# Patient Record
Sex: Male | Born: 1960 | Race: White | Hispanic: No | Marital: Married | Smoking: Never smoker
Health system: Southern US, Community
[De-identification: ages and names within clinical notes are randomized; demographics above are authoritative.]

## PROBLEM LIST (undated history)

## (undated) DIAGNOSIS — I2510 Atherosclerotic heart disease of native coronary artery without angina pectoris: Secondary | ICD-10-CM

## (undated) DIAGNOSIS — I4891 Unspecified atrial fibrillation: Secondary | ICD-10-CM

## (undated) DIAGNOSIS — I10 Essential (primary) hypertension: Secondary | ICD-10-CM

## (undated) DIAGNOSIS — E785 Hyperlipidemia, unspecified: Secondary | ICD-10-CM

## (undated) DIAGNOSIS — I679 Cerebrovascular disease, unspecified: Secondary | ICD-10-CM

## (undated) DIAGNOSIS — G4733 Obstructive sleep apnea (adult) (pediatric): Secondary | ICD-10-CM

## (undated) HISTORY — DX: Obstructive sleep apnea (adult) (pediatric): G47.33

## (undated) HISTORY — DX: Morbid (severe) obesity due to excess calories: E66.01

## (undated) HISTORY — DX: Cerebrovascular disease, unspecified: I67.9

## (undated) HISTORY — DX: Unspecified atrial fibrillation: I48.91

## (undated) HISTORY — DX: Hyperlipidemia, unspecified: E78.5

## (undated) HISTORY — PX: ATRIAL ABLATION SURGERY: SHX560

## (undated) HISTORY — DX: Essential (primary) hypertension: I10

## (undated) HISTORY — PX: EYE SURGERY: SHX253

## (undated) HISTORY — DX: Atherosclerotic heart disease of native coronary artery without angina pectoris: I25.10

---

## 1999-05-31 ENCOUNTER — Inpatient Hospital Stay (HOSPITAL_COMMUNITY): Admission: AD | Admit: 1999-05-31 | Discharge: 1999-06-08 | Payer: Self-pay | Admitting: *Deleted

## 1999-06-09 ENCOUNTER — Other Ambulatory Visit: Admission: RE | Admit: 1999-06-09 | Discharge: 1999-06-15 | Payer: Self-pay

## 2001-06-14 ENCOUNTER — Encounter: Payer: Self-pay | Admitting: Emergency Medicine

## 2001-06-14 ENCOUNTER — Emergency Department (HOSPITAL_COMMUNITY): Admission: EM | Admit: 2001-06-14 | Discharge: 2001-06-14 | Payer: Self-pay | Admitting: Emergency Medicine

## 2004-09-04 DIAGNOSIS — I2510 Atherosclerotic heart disease of native coronary artery without angina pectoris: Secondary | ICD-10-CM

## 2004-09-04 HISTORY — DX: Atherosclerotic heart disease of native coronary artery without angina pectoris: I25.10

## 2005-04-26 ENCOUNTER — Inpatient Hospital Stay (HOSPITAL_COMMUNITY): Admission: AD | Admit: 2005-04-26 | Discharge: 2005-04-30 | Payer: Self-pay | Admitting: Cardiology

## 2005-04-26 ENCOUNTER — Encounter: Payer: Self-pay | Admitting: Emergency Medicine

## 2005-04-28 ENCOUNTER — Ambulatory Visit: Payer: Self-pay | Admitting: Cardiology

## 2005-05-15 ENCOUNTER — Ambulatory Visit: Payer: Self-pay | Admitting: Internal Medicine

## 2005-05-22 ENCOUNTER — Observation Stay (HOSPITAL_COMMUNITY): Admission: EM | Admit: 2005-05-22 | Discharge: 2005-05-23 | Payer: Self-pay | Admitting: *Deleted

## 2005-05-25 ENCOUNTER — Encounter (HOSPITAL_COMMUNITY): Admission: RE | Admit: 2005-05-25 | Discharge: 2005-08-23 | Payer: Self-pay | Admitting: Cardiology

## 2005-07-12 ENCOUNTER — Ambulatory Visit: Payer: Self-pay | Admitting: Cardiology

## 2005-08-24 ENCOUNTER — Encounter (HOSPITAL_COMMUNITY): Admission: RE | Admit: 2005-08-24 | Discharge: 2005-09-04 | Payer: Self-pay | Admitting: Cardiology

## 2005-09-05 ENCOUNTER — Encounter (HOSPITAL_COMMUNITY): Admission: RE | Admit: 2005-09-05 | Discharge: 2005-12-04 | Payer: Self-pay | Admitting: Cardiology

## 2005-09-06 ENCOUNTER — Ambulatory Visit: Payer: Self-pay | Admitting: Cardiology

## 2005-10-09 ENCOUNTER — Encounter: Payer: Self-pay | Admitting: Cardiology

## 2005-10-09 ENCOUNTER — Ambulatory Visit: Payer: Self-pay

## 2005-10-09 ENCOUNTER — Ambulatory Visit: Payer: Self-pay | Admitting: Cardiology

## 2005-12-05 ENCOUNTER — Encounter (HOSPITAL_COMMUNITY): Admission: RE | Admit: 2005-12-05 | Discharge: 2006-02-28 | Payer: Self-pay | Admitting: Cardiology

## 2006-03-19 ENCOUNTER — Ambulatory Visit: Payer: Self-pay | Admitting: Cardiology

## 2006-03-28 ENCOUNTER — Encounter: Payer: Self-pay | Admitting: Cardiology

## 2006-03-28 ENCOUNTER — Ambulatory Visit: Payer: Self-pay

## 2006-10-09 ENCOUNTER — Ambulatory Visit: Payer: Self-pay | Admitting: Cardiology

## 2006-10-09 LAB — CONVERTED CEMR LAB
Alkaline Phosphatase: 51 units/L (ref 39–117)
BUN: 13 mg/dL (ref 6–23)
CO2: 32 meq/L (ref 19–32)
Creatinine, Ser: 0.9 mg/dL (ref 0.4–1.5)
Glucose, Bld: 102 mg/dL — ABNORMAL HIGH (ref 70–99)
Potassium: 4.4 meq/L (ref 3.5–5.1)
TSH: 1.67 microintl units/mL (ref 0.35–5.50)
Total Bilirubin: 1.1 mg/dL (ref 0.3–1.2)
Total Protein: 6.6 g/dL (ref 6.0–8.3)

## 2006-10-10 ENCOUNTER — Encounter: Payer: Self-pay | Admitting: Cardiology

## 2006-10-10 ENCOUNTER — Ambulatory Visit: Payer: Self-pay

## 2006-10-16 ENCOUNTER — Ambulatory Visit: Payer: Self-pay | Admitting: Cardiology

## 2006-10-19 ENCOUNTER — Ambulatory Visit: Payer: Self-pay | Admitting: Cardiology

## 2006-10-23 ENCOUNTER — Ambulatory Visit: Payer: Self-pay | Admitting: Cardiology

## 2006-10-30 ENCOUNTER — Ambulatory Visit: Payer: Self-pay | Admitting: Cardiology

## 2006-11-06 ENCOUNTER — Ambulatory Visit: Payer: Self-pay | Admitting: Internal Medicine

## 2006-11-13 ENCOUNTER — Ambulatory Visit: Payer: Self-pay | Admitting: Cardiology

## 2006-11-21 ENCOUNTER — Ambulatory Visit: Payer: Self-pay | Admitting: Cardiology

## 2006-11-27 ENCOUNTER — Ambulatory Visit: Payer: Self-pay | Admitting: Cardiology

## 2006-12-04 ENCOUNTER — Ambulatory Visit: Payer: Self-pay | Admitting: Internal Medicine

## 2006-12-12 ENCOUNTER — Ambulatory Visit: Payer: Self-pay | Admitting: Cardiology

## 2006-12-18 ENCOUNTER — Ambulatory Visit: Payer: Self-pay | Admitting: Cardiovascular Disease

## 2006-12-18 LAB — CONVERTED CEMR LAB
Basophils Relative: 0.7 % (ref 0.0–1.0)
CO2: 32 meq/L (ref 19–32)
Eosinophils Relative: 9.2 % — ABNORMAL HIGH (ref 0.0–5.0)
GFR calc Af Amer: 117 mL/min
Glucose, Bld: 91 mg/dL (ref 70–99)
Hemoglobin: 16.2 g/dL (ref 13.0–17.0)
Lymphocytes Relative: 34.6 % (ref 12.0–46.0)
Monocytes Absolute: 0.7 10*3/uL (ref 0.2–0.7)
Monocytes Relative: 11.8 % — ABNORMAL HIGH (ref 3.0–11.0)
Neutro Abs: 2.7 10*3/uL (ref 1.4–7.7)
Potassium: 4.3 meq/L (ref 3.5–5.1)
Prothrombin Time: 17.8 s — ABNORMAL HIGH (ref 10.0–14.0)
WBC: 6.1 10*3/uL (ref 4.5–10.5)

## 2006-12-26 ENCOUNTER — Ambulatory Visit: Payer: Self-pay | Admitting: Internal Medicine

## 2006-12-31 ENCOUNTER — Ambulatory Visit: Payer: Self-pay | Admitting: Cardiology

## 2007-01-01 ENCOUNTER — Ambulatory Visit: Payer: Self-pay | Admitting: Cardiology

## 2007-01-01 ENCOUNTER — Ambulatory Visit (HOSPITAL_COMMUNITY): Admission: RE | Admit: 2007-01-01 | Discharge: 2007-01-01 | Payer: Self-pay | Admitting: Cardiology

## 2007-01-11 ENCOUNTER — Ambulatory Visit: Payer: Self-pay | Admitting: Cardiology

## 2007-01-30 ENCOUNTER — Ambulatory Visit: Payer: Self-pay | Admitting: Cardiology

## 2007-02-27 ENCOUNTER — Ambulatory Visit: Payer: Self-pay | Admitting: Internal Medicine

## 2007-03-06 ENCOUNTER — Ambulatory Visit: Payer: Self-pay

## 2007-03-06 ENCOUNTER — Encounter: Payer: Self-pay | Admitting: Cardiology

## 2007-03-06 ENCOUNTER — Ambulatory Visit: Payer: Self-pay | Admitting: Cardiology

## 2007-03-11 ENCOUNTER — Ambulatory Visit: Payer: Self-pay | Admitting: Cardiovascular Disease

## 2007-03-15 ENCOUNTER — Ambulatory Visit: Payer: Self-pay | Admitting: Cardiology

## 2007-03-16 ENCOUNTER — Ambulatory Visit: Payer: Self-pay | Admitting: Cardiology

## 2007-03-16 ENCOUNTER — Inpatient Hospital Stay (HOSPITAL_COMMUNITY): Admission: EM | Admit: 2007-03-16 | Discharge: 2007-03-19 | Payer: Self-pay | Admitting: Cardiology

## 2007-03-18 ENCOUNTER — Ambulatory Visit: Admission: RE | Admit: 2007-03-18 | Discharge: 2007-03-18 | Payer: Self-pay | Admitting: Cardiology

## 2007-03-22 ENCOUNTER — Ambulatory Visit: Payer: Self-pay | Admitting: Internal Medicine

## 2007-03-27 ENCOUNTER — Ambulatory Visit: Payer: Self-pay | Admitting: Internal Medicine

## 2007-04-02 ENCOUNTER — Ambulatory Visit: Payer: Self-pay | Admitting: Cardiology

## 2007-04-02 LAB — CONVERTED CEMR LAB
AST: 48 units/L — ABNORMAL HIGH (ref 0–37)
Albumin: 3.9 g/dL (ref 3.5–5.2)
Alkaline Phosphatase: 53 units/L (ref 39–117)
Total Bilirubin: 1.4 mg/dL — ABNORMAL HIGH (ref 0.3–1.2)

## 2007-04-16 ENCOUNTER — Ambulatory Visit: Payer: Self-pay | Admitting: Cardiology

## 2007-04-16 LAB — CONVERTED CEMR LAB: Prothrombin Time: 28.2 s — ABNORMAL HIGH (ref 10.0–14.0)

## 2007-04-30 ENCOUNTER — Ambulatory Visit: Payer: Self-pay | Admitting: Cardiology

## 2007-05-15 ENCOUNTER — Ambulatory Visit: Payer: Self-pay | Admitting: Cardiology

## 2007-05-21 ENCOUNTER — Ambulatory Visit: Payer: Self-pay | Admitting: Cardiology

## 2007-05-21 LAB — CONVERTED CEMR LAB
ALT: 42 units/L (ref 0–53)
AST: 36 units/L (ref 0–37)
Albumin: 3.6 g/dL (ref 3.5–5.2)
TSH: 1.82 microintl units/mL (ref 0.35–5.50)

## 2007-06-11 ENCOUNTER — Ambulatory Visit: Payer: Self-pay | Admitting: Cardiology

## 2007-07-03 ENCOUNTER — Ambulatory Visit: Payer: Self-pay | Admitting: Cardiology

## 2007-07-10 ENCOUNTER — Ambulatory Visit: Payer: Self-pay | Admitting: Cardiology

## 2007-07-30 ENCOUNTER — Ambulatory Visit: Payer: Self-pay | Admitting: Cardiovascular Disease

## 2007-08-21 ENCOUNTER — Ambulatory Visit: Payer: Self-pay | Admitting: Cardiology

## 2007-09-06 ENCOUNTER — Ambulatory Visit: Payer: Self-pay | Admitting: Cardiology

## 2007-09-17 ENCOUNTER — Ambulatory Visit: Payer: Self-pay

## 2007-09-17 ENCOUNTER — Encounter: Payer: Self-pay | Admitting: Cardiology

## 2007-09-17 ENCOUNTER — Ambulatory Visit: Payer: Self-pay | Admitting: Cardiology

## 2007-09-17 LAB — CONVERTED CEMR LAB
Alkaline Phosphatase: 59 units/L (ref 39–117)
BUN: 13 mg/dL (ref 6–23)
Bilirubin, Direct: 0.2 mg/dL (ref 0.0–0.3)
CO2: 31 meq/L (ref 19–32)
GFR calc Af Amer: 117 mL/min
Potassium: 5.1 meq/L (ref 3.5–5.1)
Sodium: 140 meq/L (ref 135–145)
Total CHOL/HDL Ratio: 4.9
Total Protein: 7.2 g/dL (ref 6.0–8.3)
Triglycerides: 81 mg/dL (ref 0–149)
VLDL: 16 mg/dL (ref 0–40)

## 2007-10-01 ENCOUNTER — Ambulatory Visit: Payer: Self-pay | Admitting: Cardiology

## 2007-10-29 ENCOUNTER — Ambulatory Visit: Payer: Self-pay | Admitting: Internal Medicine

## 2007-11-12 ENCOUNTER — Ambulatory Visit: Payer: Self-pay | Admitting: Cardiology

## 2007-11-27 ENCOUNTER — Ambulatory Visit: Payer: Self-pay | Admitting: Cardiology

## 2007-12-18 ENCOUNTER — Ambulatory Visit: Payer: Self-pay | Admitting: Internal Medicine

## 2008-01-15 ENCOUNTER — Ambulatory Visit: Payer: Self-pay | Admitting: Internal Medicine

## 2008-02-11 ENCOUNTER — Ambulatory Visit: Payer: Self-pay | Admitting: Cardiovascular Disease

## 2008-02-11 ENCOUNTER — Ambulatory Visit: Payer: Self-pay | Admitting: Cardiology

## 2008-02-13 ENCOUNTER — Encounter: Payer: Self-pay | Admitting: Emergency Medicine

## 2008-02-13 ENCOUNTER — Ambulatory Visit: Payer: Self-pay | Admitting: Cardiovascular Disease

## 2008-02-13 ENCOUNTER — Inpatient Hospital Stay (HOSPITAL_COMMUNITY): Admission: AD | Admit: 2008-02-13 | Discharge: 2008-02-15 | Payer: Self-pay | Admitting: Internal Medicine

## 2008-02-14 ENCOUNTER — Ambulatory Visit: Payer: Self-pay | Admitting: Vascular Surgery

## 2008-02-14 ENCOUNTER — Encounter (INDEPENDENT_AMBULATORY_CARE_PROVIDER_SITE_OTHER): Payer: Self-pay | Admitting: Internal Medicine

## 2008-03-10 ENCOUNTER — Ambulatory Visit: Payer: Self-pay | Admitting: Cardiology

## 2008-03-31 ENCOUNTER — Ambulatory Visit: Payer: Self-pay | Admitting: Cardiology

## 2008-05-05 ENCOUNTER — Ambulatory Visit: Payer: Self-pay | Admitting: Cardiology

## 2008-05-27 ENCOUNTER — Ambulatory Visit: Payer: Self-pay | Admitting: Cardiology

## 2008-06-10 ENCOUNTER — Ambulatory Visit: Payer: Self-pay | Admitting: Cardiology

## 2008-06-23 ENCOUNTER — Ambulatory Visit: Payer: Self-pay | Admitting: Cardiology

## 2008-08-05 ENCOUNTER — Ambulatory Visit: Payer: Self-pay | Admitting: Internal Medicine

## 2008-09-09 ENCOUNTER — Ambulatory Visit: Payer: Self-pay | Admitting: Cardiovascular Disease

## 2008-09-09 ENCOUNTER — Ambulatory Visit: Payer: Self-pay | Admitting: Cardiology

## 2008-10-14 ENCOUNTER — Ambulatory Visit: Payer: Self-pay | Admitting: Cardiology

## 2008-11-11 ENCOUNTER — Ambulatory Visit: Payer: Self-pay | Admitting: Cardiology

## 2008-12-07 ENCOUNTER — Encounter (INDEPENDENT_AMBULATORY_CARE_PROVIDER_SITE_OTHER): Payer: Self-pay | Admitting: *Deleted

## 2008-12-08 ENCOUNTER — Ambulatory Visit: Payer: Self-pay | Admitting: Cardiovascular Disease

## 2008-12-18 IMAGING — CT CT HEAD W/O CM
1 series · 16 of 30 positions shown, 20 images · non-contrast
Comparison: None

CLINICAL DATA: Short-term memory loss.  Lightheadedness.  Atrial
fibrillation.  Myocardial infarction.

CT HEAD WITHOUT CONTRAST
TECHNIQUE: Contiguous axial images were obtained from the base of
the skull through the vertex without contrast.

[Series 2: head 4.8 h37s · axial · 0.47mm/px · z∈[-183,-27]mm · 16 of 36 slices shown, 20 images]
[im 2/36  brain]
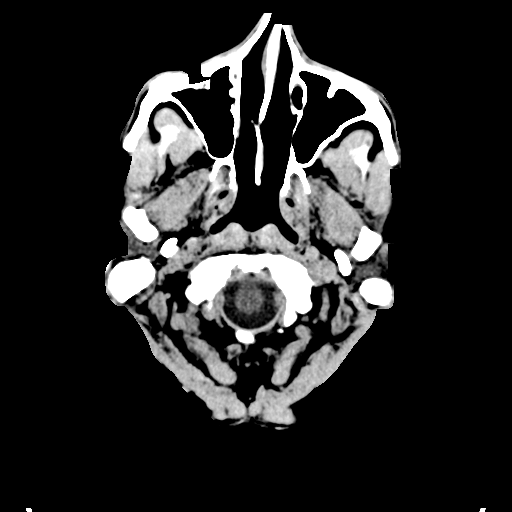
[im 2/36  bone]
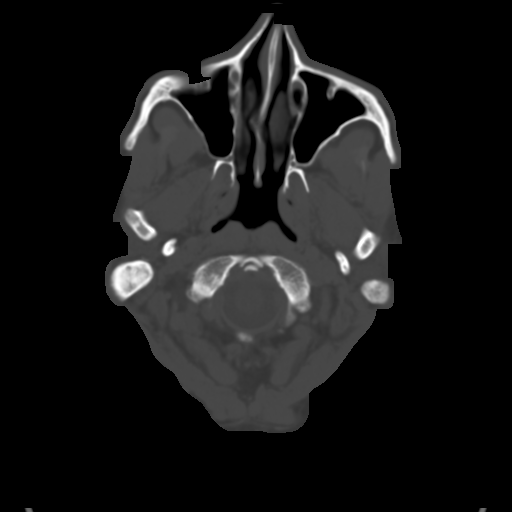
[im 4/36  brain]
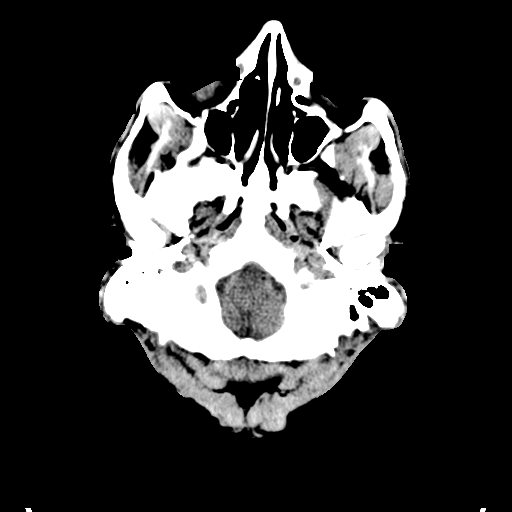
[im 7/36  brain]
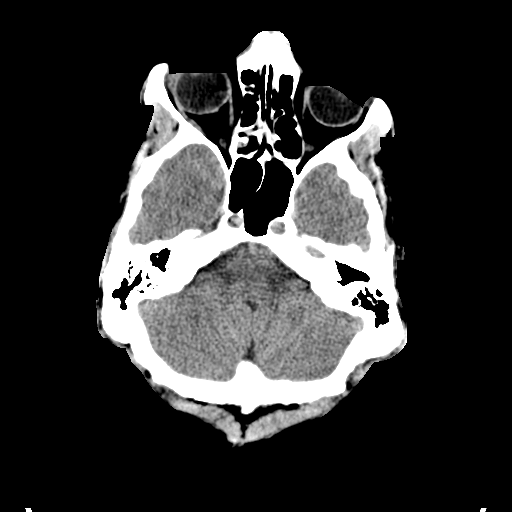
[im 9/36  brain]
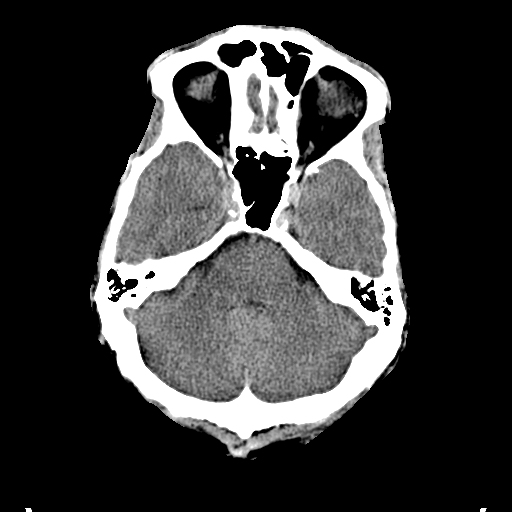
[im 10/36  brain]
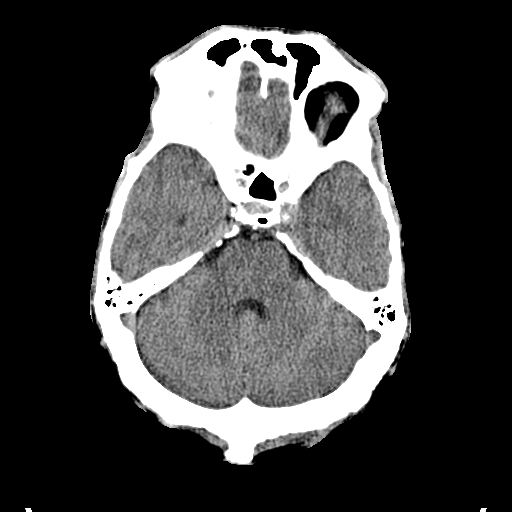
[im 10/36  bone]
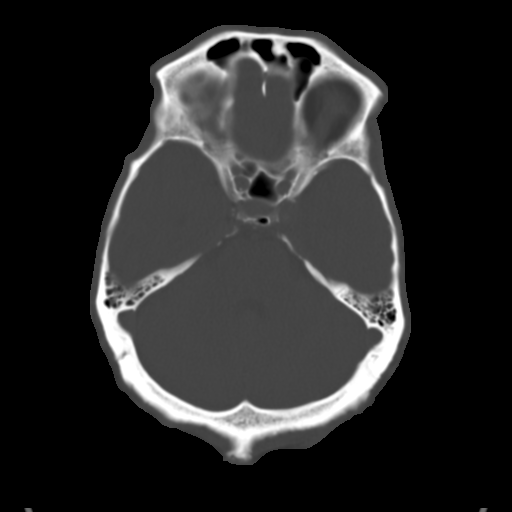
[im 13/36  brain]
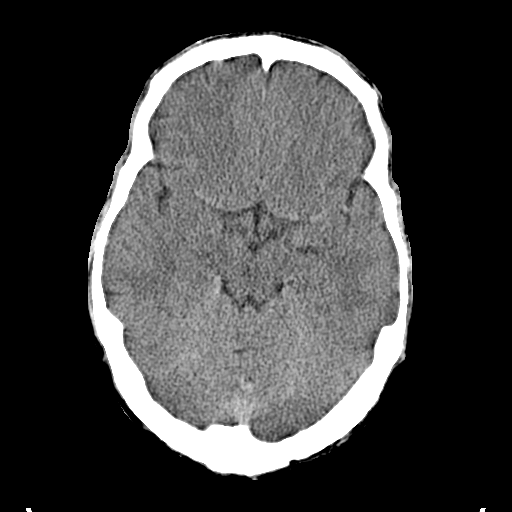
[im 15/36  brain]
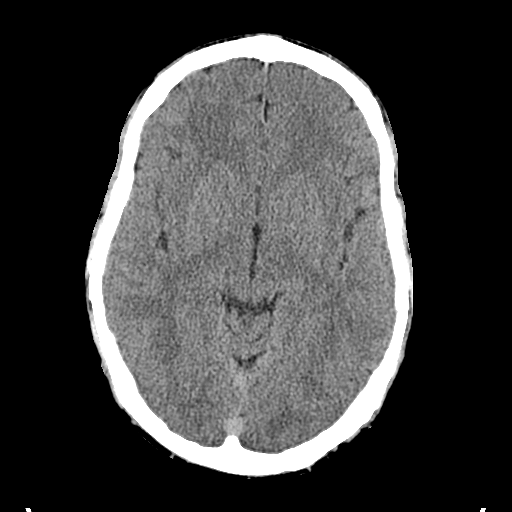
[im 17/36  brain]
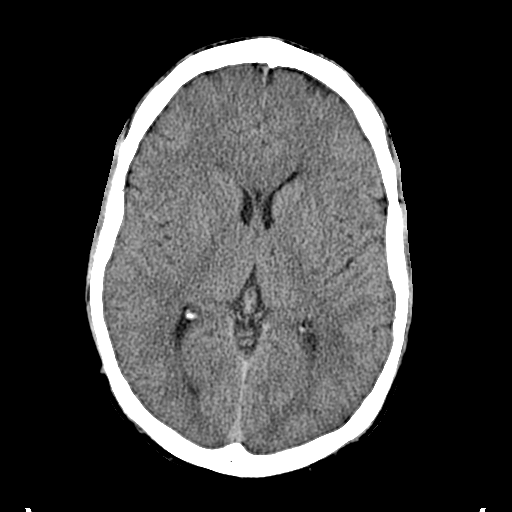
[im 19/36  brain]
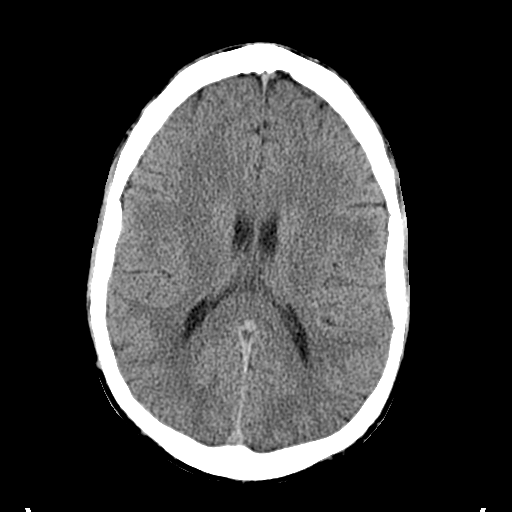
[im 19/36  bone]
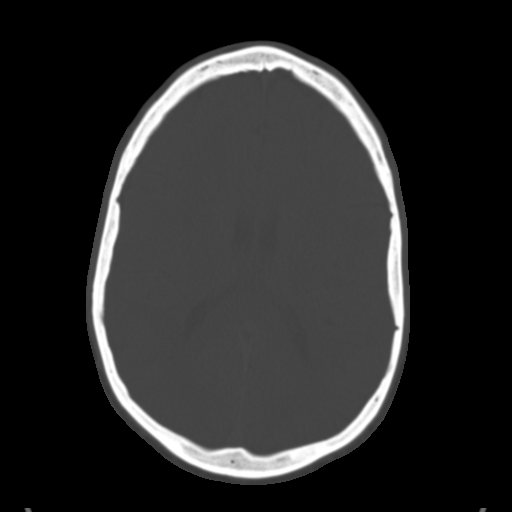
[im 21/36  brain]
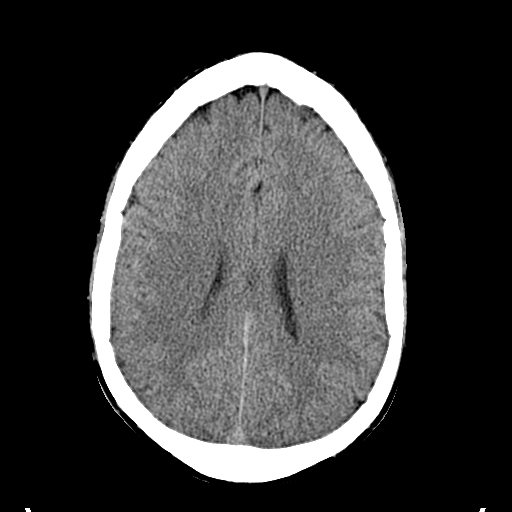
[im 23/36  brain]
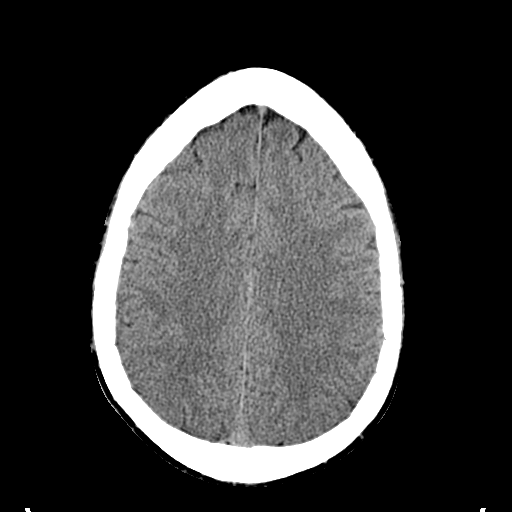
[im 26/36  brain]
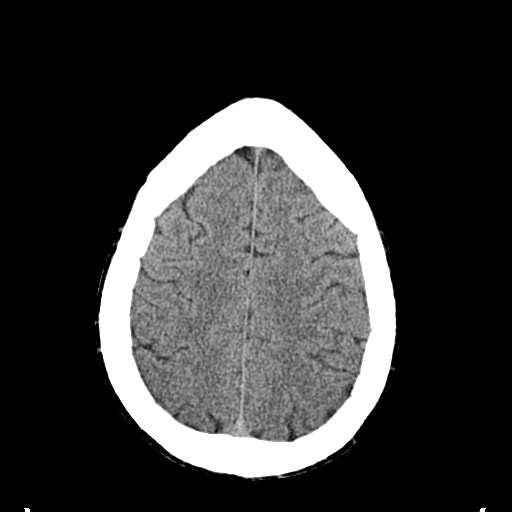
[im 27/36  brain]
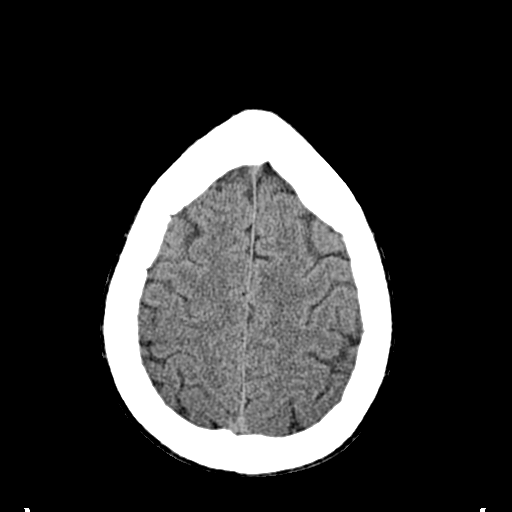
[im 27/36  bone]
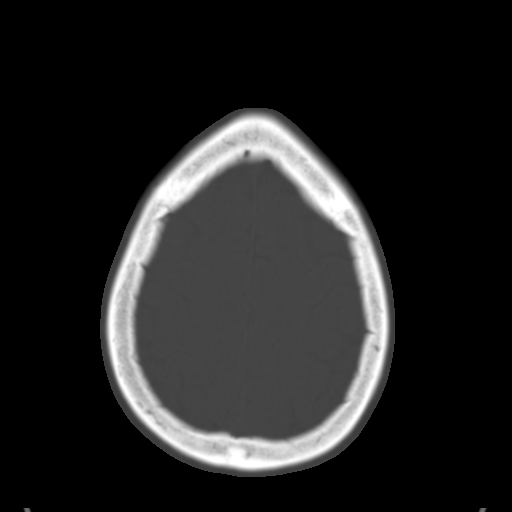
[im 29/36  brain]
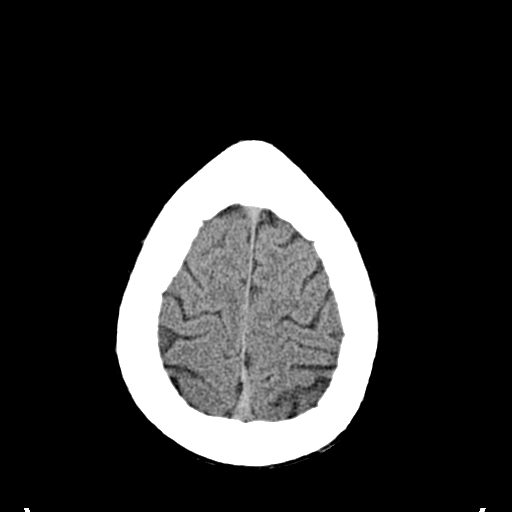
[im 32/36  brain]
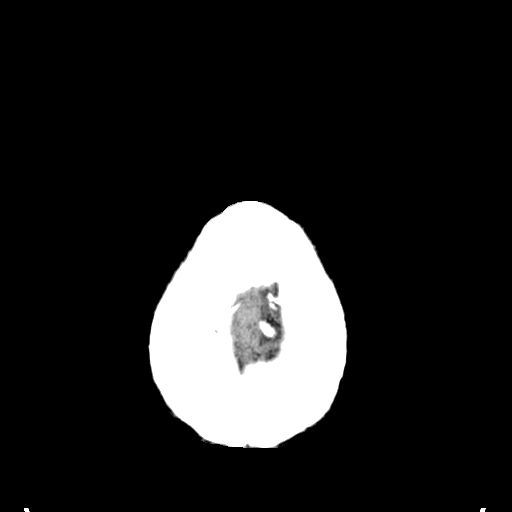
[im 34/36  brain]
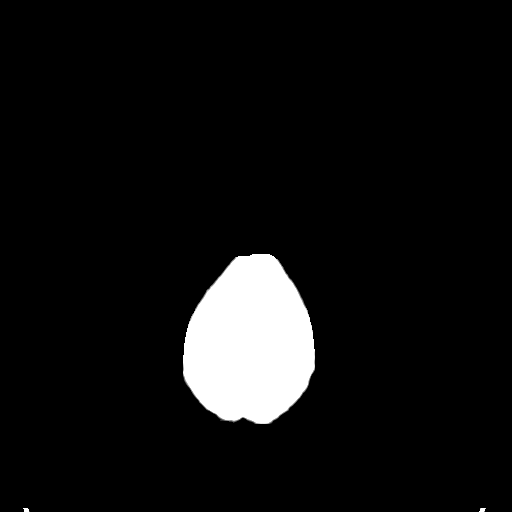

[16 of 30 positions shown; findings below may reference images not displayed]

FINDINGS: There is no sign of acute infarction, mass lesion,
hemorrhage, hydrocephalus or extra-axial collection.  There is a
tiny low density in the basal ganglia on the left which could be an
old lacunar stroke.  No other abnormality is seen.  The calvarium
is unremarkable.  The sinuses, middle ears and mastoids are clear.
IMPRESSION: No evidence of acute or reversible process.  Possible old lacunar
stroke left basal ganglia

## 2008-12-21 ENCOUNTER — Telehealth: Payer: Self-pay | Admitting: Cardiology

## 2008-12-21 ENCOUNTER — Ambulatory Visit: Payer: Self-pay

## 2008-12-21 ENCOUNTER — Encounter: Payer: Self-pay | Admitting: Cardiovascular Disease

## 2008-12-21 DIAGNOSIS — E782 Mixed hyperlipidemia: Secondary | ICD-10-CM

## 2008-12-21 DIAGNOSIS — I2510 Atherosclerotic heart disease of native coronary artery without angina pectoris: Secondary | ICD-10-CM

## 2008-12-21 DIAGNOSIS — I10 Essential (primary) hypertension: Secondary | ICD-10-CM | POA: Insufficient documentation

## 2008-12-21 DIAGNOSIS — I4891 Unspecified atrial fibrillation: Secondary | ICD-10-CM | POA: Insufficient documentation

## 2009-01-05 ENCOUNTER — Ambulatory Visit: Payer: Self-pay | Admitting: Internal Medicine

## 2009-01-05 ENCOUNTER — Ambulatory Visit: Payer: Self-pay | Admitting: Cardiology

## 2009-01-05 LAB — CONVERTED CEMR LAB
BUN: 18 mg/dL (ref 6–23)
Chloride: 102 meq/L (ref 96–112)
INR: 4.2 — ABNORMAL HIGH (ref 0.8–1.0)
Potassium: 5.1 meq/L (ref 3.5–5.1)
Prothrombin Time: 41.8 s — ABNORMAL HIGH (ref 10.9–13.3)
Sodium: 140 meq/L (ref 135–145)

## 2009-01-11 ENCOUNTER — Telehealth: Payer: Self-pay | Admitting: Cardiology

## 2009-01-20 ENCOUNTER — Ambulatory Visit: Payer: Self-pay | Admitting: Internal Medicine

## 2009-01-20 ENCOUNTER — Ambulatory Visit: Payer: Self-pay | Admitting: Cardiology

## 2009-01-22 ENCOUNTER — Encounter: Payer: Self-pay | Admitting: Internal Medicine

## 2009-01-27 ENCOUNTER — Ambulatory Visit: Payer: Self-pay | Admitting: Cardiology

## 2009-02-02 ENCOUNTER — Encounter: Payer: Self-pay | Admitting: *Deleted

## 2009-02-03 ENCOUNTER — Ambulatory Visit: Payer: Self-pay | Admitting: Cardiology

## 2009-02-03 ENCOUNTER — Ambulatory Visit: Payer: Self-pay | Admitting: Internal Medicine

## 2009-02-03 LAB — CONVERTED CEMR LAB
Basophils Relative: 0 % (ref 0.0–3.0)
Calcium: 9.1 mg/dL (ref 8.4–10.5)
Creatinine, Ser: 0.9 mg/dL (ref 0.4–1.5)
GFR calc non Af Amer: 95.64 mL/min (ref >60)
Hemoglobin: 15.5 g/dL (ref 13.0–17.0)
Lymphocytes Relative: 35.4 % (ref 12.0–46.0)
Monocytes Relative: 13 % — ABNORMAL HIGH (ref 3.0–12.0)
Neutro Abs: 1.7 10*3/uL (ref 1.4–7.7)
Neutrophils Relative %: 40.9 % — ABNORMAL LOW (ref 43.0–77.0)
POC INR: 2.3
Protime: 18.7
RBC: 4.69 M/uL (ref 4.22–5.81)
Sodium: 145 meq/L (ref 135–145)
WBC: 4.4 10*3/uL — ABNORMAL LOW (ref 4.5–10.5)

## 2009-02-08 ENCOUNTER — Observation Stay (HOSPITAL_COMMUNITY): Admission: RE | Admit: 2009-02-08 | Discharge: 2009-02-09 | Payer: Self-pay | Admitting: Internal Medicine

## 2009-02-08 ENCOUNTER — Encounter: Payer: Self-pay | Admitting: Cardiology

## 2009-02-08 ENCOUNTER — Ambulatory Visit: Payer: Self-pay | Admitting: Internal Medicine

## 2009-02-10 ENCOUNTER — Telehealth: Payer: Self-pay | Admitting: Internal Medicine

## 2009-02-11 ENCOUNTER — Ambulatory Visit: Payer: Self-pay | Admitting: Cardiology

## 2009-02-11 LAB — CONVERTED CEMR LAB
POC INR: 2.4
Protime: 19

## 2009-02-12 ENCOUNTER — Telehealth: Payer: Self-pay | Admitting: Internal Medicine

## 2009-02-12 ENCOUNTER — Encounter: Payer: Self-pay | Admitting: Internal Medicine

## 2009-02-17 ENCOUNTER — Ambulatory Visit: Payer: Self-pay | Admitting: Internal Medicine

## 2009-02-17 ENCOUNTER — Encounter: Payer: Self-pay | Admitting: Internal Medicine

## 2009-02-17 LAB — CONVERTED CEMR LAB: Protime: 18.4

## 2009-03-10 ENCOUNTER — Encounter: Payer: Self-pay | Admitting: *Deleted

## 2009-03-17 ENCOUNTER — Ambulatory Visit: Payer: Self-pay | Admitting: Cardiology

## 2009-03-17 LAB — CONVERTED CEMR LAB
POC INR: 2.5
Prothrombin Time: 19.2 s

## 2009-04-06 ENCOUNTER — Telehealth: Payer: Self-pay | Admitting: Cardiology

## 2009-04-14 ENCOUNTER — Ambulatory Visit: Payer: Self-pay | Admitting: Cardiology

## 2009-04-14 LAB — CONVERTED CEMR LAB
INR: 2.4 — ABNORMAL HIGH (ref 0.8–1.0)
Prothrombin Time: 24.8 s — ABNORMAL HIGH (ref 9.1–11.7)

## 2009-04-15 ENCOUNTER — Encounter (INDEPENDENT_AMBULATORY_CARE_PROVIDER_SITE_OTHER): Payer: Self-pay | Admitting: *Deleted

## 2009-04-20 ENCOUNTER — Telehealth: Payer: Self-pay | Admitting: Cardiology

## 2009-05-05 ENCOUNTER — Ambulatory Visit: Payer: Self-pay | Admitting: Internal Medicine

## 2009-05-05 ENCOUNTER — Ambulatory Visit: Payer: Self-pay | Admitting: Cardiology

## 2009-06-08 ENCOUNTER — Ambulatory Visit: Payer: Self-pay | Admitting: Cardiology

## 2009-06-08 LAB — CONVERTED CEMR LAB: POC INR: 1.6

## 2009-06-22 ENCOUNTER — Ambulatory Visit: Payer: Self-pay | Admitting: Internal Medicine

## 2009-06-22 LAB — CONVERTED CEMR LAB: POC INR: 1.6

## 2009-07-06 ENCOUNTER — Ambulatory Visit: Payer: Self-pay | Admitting: Cardiology

## 2009-07-06 LAB — CONVERTED CEMR LAB: POC INR: 1.7

## 2009-07-21 ENCOUNTER — Ambulatory Visit: Payer: Self-pay | Admitting: Cardiology

## 2009-08-05 ENCOUNTER — Other Ambulatory Visit: Payer: Self-pay | Admitting: Emergency Medicine

## 2009-08-06 ENCOUNTER — Other Ambulatory Visit: Payer: Self-pay | Admitting: Emergency Medicine

## 2009-08-06 ENCOUNTER — Inpatient Hospital Stay (HOSPITAL_COMMUNITY): Admission: EM | Admit: 2009-08-06 | Discharge: 2009-08-06 | Payer: Self-pay | Admitting: Internal Medicine

## 2009-08-06 ENCOUNTER — Ambulatory Visit: Payer: Self-pay | Admitting: Cardiology

## 2009-08-11 ENCOUNTER — Ambulatory Visit: Payer: Self-pay | Admitting: Cardiology

## 2009-08-11 ENCOUNTER — Encounter: Payer: Self-pay | Admitting: Internal Medicine

## 2009-08-11 ENCOUNTER — Ambulatory Visit: Payer: Self-pay | Admitting: Internal Medicine

## 2009-08-11 LAB — CONVERTED CEMR LAB: POC INR: 2.4

## 2009-08-12 ENCOUNTER — Encounter: Payer: Self-pay | Admitting: Internal Medicine

## 2009-08-18 ENCOUNTER — Ambulatory Visit: Payer: Self-pay | Admitting: Internal Medicine

## 2009-08-18 LAB — CONVERTED CEMR LAB
Basophils Absolute: 0 10*3/uL (ref 0.0–0.1)
CO2: 31 meq/L (ref 19–32)
Calcium: 9.4 mg/dL (ref 8.4–10.5)
Creatinine, Ser: 0.8 mg/dL (ref 0.4–1.5)
Eosinophils Absolute: 0.4 10*3/uL (ref 0.0–0.7)
GFR calc non Af Amer: 109.32 mL/min (ref >60)
Glucose, Bld: 96 mg/dL (ref 70–99)
Hemoglobin: 15.1 g/dL (ref 13.0–17.0)
INR: 2.4 — ABNORMAL HIGH (ref 0.8–1.0)
Lymphocytes Relative: 37.9 % (ref 12.0–46.0)
MCHC: 33.3 g/dL (ref 30.0–36.0)
Monocytes Relative: 11.2 % (ref 3.0–12.0)
Neutrophils Relative %: 41.7 % — ABNORMAL LOW (ref 43.0–77.0)
Prothrombin Time: 24.4 s — ABNORMAL HIGH (ref 9.1–11.7)
RDW: 13.3 % (ref 11.5–14.6)
Sodium: 139 meq/L (ref 135–145)
aPTT: 32.5 s — ABNORMAL HIGH (ref 21.7–28.8)

## 2009-08-24 ENCOUNTER — Telehealth (INDEPENDENT_AMBULATORY_CARE_PROVIDER_SITE_OTHER): Payer: Self-pay | Admitting: Cardiology

## 2009-08-24 ENCOUNTER — Encounter: Payer: Self-pay | Admitting: Cardiology

## 2009-08-24 ENCOUNTER — Ambulatory Visit: Payer: Self-pay | Admitting: Cardiology

## 2009-08-24 ENCOUNTER — Ambulatory Visit (HOSPITAL_COMMUNITY): Admission: RE | Admit: 2009-08-24 | Discharge: 2009-08-24 | Payer: Self-pay | Admitting: Cardiology

## 2009-08-25 ENCOUNTER — Ambulatory Visit: Payer: Self-pay | Admitting: Cardiology

## 2009-08-25 LAB — CONVERTED CEMR LAB: POC INR: 1.9

## 2009-08-26 ENCOUNTER — Ambulatory Visit: Payer: Self-pay | Admitting: Internal Medicine

## 2009-09-01 ENCOUNTER — Ambulatory Visit: Payer: Self-pay | Admitting: Cardiovascular Disease

## 2009-09-22 ENCOUNTER — Ambulatory Visit: Payer: Self-pay | Admitting: Internal Medicine

## 2009-09-22 ENCOUNTER — Ambulatory Visit: Payer: Self-pay | Admitting: Cardiovascular Disease

## 2009-09-22 LAB — CONVERTED CEMR LAB: POC INR: 1.7

## 2009-09-23 ENCOUNTER — Telehealth: Payer: Self-pay | Admitting: Cardiology

## 2009-10-06 ENCOUNTER — Ambulatory Visit: Payer: Self-pay | Admitting: Cardiovascular Disease

## 2009-10-19 ENCOUNTER — Telehealth: Payer: Self-pay | Admitting: Internal Medicine

## 2009-10-20 ENCOUNTER — Encounter: Payer: Self-pay | Admitting: Internal Medicine

## 2009-10-20 ENCOUNTER — Ambulatory Visit: Payer: Self-pay | Admitting: Cardiology

## 2009-11-17 ENCOUNTER — Ambulatory Visit: Payer: Self-pay | Admitting: Cardiology

## 2009-11-17 LAB — CONVERTED CEMR LAB: POC INR: 2

## 2009-12-15 ENCOUNTER — Ambulatory Visit: Payer: Self-pay | Admitting: Cardiovascular Disease

## 2009-12-15 LAB — CONVERTED CEMR LAB: POC INR: 2.1

## 2009-12-29 ENCOUNTER — Other Ambulatory Visit: Payer: Self-pay | Admitting: Emergency Medicine

## 2009-12-29 ENCOUNTER — Inpatient Hospital Stay (HOSPITAL_COMMUNITY): Admission: AD | Admit: 2009-12-29 | Discharge: 2009-12-31 | Payer: Self-pay | Admitting: Internal Medicine

## 2009-12-29 ENCOUNTER — Ambulatory Visit: Payer: Self-pay | Admitting: Internal Medicine

## 2009-12-29 DIAGNOSIS — L02519 Cutaneous abscess of unspecified hand: Secondary | ICD-10-CM

## 2009-12-29 DIAGNOSIS — L03119 Cellulitis of unspecified part of limb: Secondary | ICD-10-CM

## 2010-01-07 ENCOUNTER — Emergency Department (HOSPITAL_COMMUNITY): Admission: EM | Admit: 2010-01-07 | Discharge: 2010-01-07 | Payer: Self-pay | Admitting: Emergency Medicine

## 2010-01-12 ENCOUNTER — Ambulatory Visit: Payer: Self-pay | Admitting: Cardiology

## 2010-01-12 LAB — CONVERTED CEMR LAB: POC INR: 1.5

## 2010-01-19 ENCOUNTER — Ambulatory Visit: Payer: Self-pay | Admitting: Cardiology

## 2010-01-19 LAB — CONVERTED CEMR LAB: POC INR: 1.8

## 2010-02-02 ENCOUNTER — Ambulatory Visit: Payer: Self-pay | Admitting: Cardiology

## 2010-02-02 LAB — CONVERTED CEMR LAB: POC INR: 1.6

## 2010-02-16 ENCOUNTER — Ambulatory Visit: Payer: Self-pay | Admitting: Cardiology

## 2010-02-16 LAB — CONVERTED CEMR LAB: POC INR: 2.3

## 2010-02-18 ENCOUNTER — Telehealth: Payer: Self-pay | Admitting: Internal Medicine

## 2010-03-02 ENCOUNTER — Ambulatory Visit: Payer: Self-pay | Admitting: Internal Medicine

## 2010-03-08 ENCOUNTER — Encounter: Payer: Self-pay | Admitting: Cardiology

## 2010-03-15 ENCOUNTER — Ambulatory Visit: Payer: Self-pay | Admitting: Cardiology

## 2010-04-12 ENCOUNTER — Ambulatory Visit: Payer: Self-pay | Admitting: Cardiology

## 2010-04-12 LAB — CONVERTED CEMR LAB: POC INR: 2.1

## 2010-04-14 ENCOUNTER — Inpatient Hospital Stay (HOSPITAL_COMMUNITY): Admission: EM | Admit: 2010-04-14 | Discharge: 2010-04-15 | Payer: Self-pay | Admitting: Emergency Medicine

## 2010-04-14 ENCOUNTER — Telehealth (INDEPENDENT_AMBULATORY_CARE_PROVIDER_SITE_OTHER): Payer: Self-pay | Admitting: Physician Assistant

## 2010-04-14 ENCOUNTER — Ambulatory Visit: Payer: Self-pay | Admitting: Internal Medicine

## 2010-04-18 ENCOUNTER — Telehealth: Payer: Self-pay | Admitting: Internal Medicine

## 2010-04-20 ENCOUNTER — Ambulatory Visit: Payer: Self-pay | Admitting: Internal Medicine

## 2010-04-20 ENCOUNTER — Ambulatory Visit: Payer: Self-pay | Admitting: Cardiovascular Disease

## 2010-04-21 ENCOUNTER — Telehealth (INDEPENDENT_AMBULATORY_CARE_PROVIDER_SITE_OTHER): Payer: Self-pay | Admitting: *Deleted

## 2010-04-22 ENCOUNTER — Telehealth: Payer: Self-pay | Admitting: Internal Medicine

## 2010-04-25 ENCOUNTER — Ambulatory Visit: Payer: Self-pay | Admitting: Cardiology

## 2010-04-25 ENCOUNTER — Ambulatory Visit: Payer: Self-pay | Admitting: Internal Medicine

## 2010-05-03 ENCOUNTER — Ambulatory Visit: Payer: Self-pay | Admitting: Cardiology

## 2010-05-03 ENCOUNTER — Telehealth (INDEPENDENT_AMBULATORY_CARE_PROVIDER_SITE_OTHER): Payer: Self-pay | Admitting: *Deleted

## 2010-05-11 ENCOUNTER — Ambulatory Visit: Payer: Self-pay | Admitting: Cardiovascular Disease

## 2010-05-11 LAB — CONVERTED CEMR LAB: POC INR: 2.3

## 2010-05-12 ENCOUNTER — Telehealth: Payer: Self-pay | Admitting: Internal Medicine

## 2010-05-13 ENCOUNTER — Encounter: Payer: Self-pay | Admitting: Internal Medicine

## 2010-05-16 ENCOUNTER — Ambulatory Visit (HOSPITAL_COMMUNITY): Admission: RE | Admit: 2010-05-16 | Discharge: 2010-05-16 | Payer: Self-pay | Admitting: Internal Medicine

## 2010-05-16 ENCOUNTER — Ambulatory Visit: Payer: Self-pay | Admitting: Cardiovascular Disease

## 2010-05-17 ENCOUNTER — Telehealth: Payer: Self-pay | Admitting: Internal Medicine

## 2010-05-18 ENCOUNTER — Ambulatory Visit: Payer: Self-pay | Admitting: Cardiology

## 2010-05-18 ENCOUNTER — Ambulatory Visit: Payer: Self-pay | Admitting: Internal Medicine

## 2010-05-23 LAB — CONVERTED CEMR LAB
BUN: 14 mg/dL (ref 6–23)
Basophils Absolute: 0 10*3/uL (ref 0.0–0.1)
CO2: 31 meq/L (ref 19–32)
Chloride: 108 meq/L (ref 96–112)
Creatinine, Ser: 0.7 mg/dL (ref 0.4–1.5)
Eosinophils Absolute: 0.4 10*3/uL (ref 0.0–0.7)
MCHC: 35.2 g/dL (ref 30.0–36.0)
MCV: 94.1 fL (ref 78.0–100.0)
Monocytes Absolute: 0.5 10*3/uL (ref 0.1–1.0)
Neutrophils Relative %: 41.7 % — ABNORMAL LOW (ref 43.0–77.0)
Platelets: 103 10*3/uL — ABNORMAL LOW (ref 150.0–400.0)
aPTT: 39.1 s — ABNORMAL HIGH (ref 21.7–28.8)

## 2010-05-25 ENCOUNTER — Encounter: Payer: Self-pay | Admitting: Cardiology

## 2010-05-25 ENCOUNTER — Ambulatory Visit: Payer: Self-pay | Admitting: Cardiology

## 2010-05-25 ENCOUNTER — Ambulatory Visit (HOSPITAL_COMMUNITY): Admission: RE | Admit: 2010-05-25 | Discharge: 2010-05-25 | Payer: Self-pay | Admitting: Cardiology

## 2010-05-26 ENCOUNTER — Ambulatory Visit (HOSPITAL_COMMUNITY): Admission: RE | Admit: 2010-05-26 | Discharge: 2010-05-26 | Payer: Self-pay | Admitting: Internal Medicine

## 2010-05-26 ENCOUNTER — Encounter: Payer: Self-pay | Admitting: Internal Medicine

## 2010-05-30 ENCOUNTER — Telehealth: Payer: Self-pay | Admitting: Internal Medicine

## 2010-05-31 ENCOUNTER — Ambulatory Visit: Payer: Self-pay | Admitting: Cardiology

## 2010-05-31 ENCOUNTER — Encounter: Payer: Self-pay | Admitting: Internal Medicine

## 2010-05-31 LAB — CONVERTED CEMR LAB: POC INR: 3.2

## 2010-06-08 ENCOUNTER — Ambulatory Visit: Payer: Self-pay | Admitting: Cardiovascular Disease

## 2010-06-15 ENCOUNTER — Ambulatory Visit: Payer: Self-pay | Admitting: Cardiovascular Disease

## 2010-06-22 ENCOUNTER — Ambulatory Visit: Payer: Self-pay | Admitting: Cardiology

## 2010-06-29 ENCOUNTER — Ambulatory Visit: Payer: Self-pay | Admitting: Cardiovascular Disease

## 2010-07-06 ENCOUNTER — Encounter: Payer: Self-pay | Admitting: Internal Medicine

## 2010-07-06 ENCOUNTER — Ambulatory Visit: Payer: Self-pay | Admitting: Cardiology

## 2010-07-11 ENCOUNTER — Ambulatory Visit: Payer: Self-pay | Admitting: Internal Medicine

## 2010-07-11 ENCOUNTER — Ambulatory Visit: Payer: Self-pay | Admitting: Cardiovascular Disease

## 2010-07-12 ENCOUNTER — Telehealth: Payer: Self-pay | Admitting: Cardiology

## 2010-07-12 LAB — CONVERTED CEMR LAB
Basophils Absolute: 0 10*3/uL (ref 0.0–0.1)
CO2: 29 meq/L (ref 19–32)
GFR calc non Af Amer: 91.54 mL/min (ref >60)
Glucose, Bld: 89 mg/dL (ref 70–99)
HCT: 41.3 % (ref 39.0–52.0)
Hemoglobin: 14.4 g/dL (ref 13.0–17.0)
Lymphs Abs: 1.4 10*3/uL (ref 0.7–4.0)
MCHC: 35 g/dL (ref 30.0–36.0)
Monocytes Relative: 9.5 % (ref 3.0–12.0)
Neutro Abs: 2.1 10*3/uL (ref 1.4–7.7)
Potassium: 4.6 meq/L (ref 3.5–5.1)
RDW: 13.4 % (ref 11.5–14.6)
Sodium: 139 meq/L (ref 135–145)

## 2010-07-13 ENCOUNTER — Ambulatory Visit: Payer: Self-pay | Admitting: Cardiology

## 2010-07-14 ENCOUNTER — Ambulatory Visit (HOSPITAL_COMMUNITY): Admission: RE | Admit: 2010-07-14 | Discharge: 2010-07-14 | Payer: Self-pay | Admitting: Cardiology

## 2010-07-14 ENCOUNTER — Encounter: Payer: Self-pay | Admitting: Cardiology

## 2010-07-14 ENCOUNTER — Telehealth: Payer: Self-pay | Admitting: Internal Medicine

## 2010-07-15 ENCOUNTER — Ambulatory Visit (HOSPITAL_COMMUNITY): Admission: RE | Admit: 2010-07-15 | Discharge: 2010-07-15 | Payer: Self-pay | Admitting: Internal Medicine

## 2010-07-18 ENCOUNTER — Encounter: Payer: Self-pay | Admitting: Cardiology

## 2010-07-18 ENCOUNTER — Encounter: Payer: Self-pay | Admitting: Internal Medicine

## 2010-07-18 ENCOUNTER — Ambulatory Visit: Payer: Self-pay | Admitting: Cardiology

## 2010-07-18 LAB — CONVERTED CEMR LAB: POC INR: 1.4

## 2010-07-19 ENCOUNTER — Ambulatory Visit: Payer: Self-pay | Admitting: Internal Medicine

## 2010-07-21 ENCOUNTER — Ambulatory Visit: Payer: Self-pay | Admitting: Internal Medicine

## 2010-07-21 ENCOUNTER — Ambulatory Visit (HOSPITAL_COMMUNITY): Admission: RE | Admit: 2010-07-21 | Discharge: 2010-07-22 | Payer: Self-pay | Admitting: Internal Medicine

## 2010-07-21 LAB — CONVERTED CEMR LAB
Basophils Relative: 0.7 % (ref 0.0–3.0)
CO2: 29 meq/L (ref 19–32)
Chloride: 100 meq/L (ref 96–112)
Creatinine, Ser: 1 mg/dL (ref 0.4–1.5)
Eosinophils Absolute: 0.5 10*3/uL (ref 0.0–0.7)
HCT: 40.4 % (ref 39.0–52.0)
Hemoglobin: 13.8 g/dL (ref 13.0–17.0)
Lymphs Abs: 2.1 10*3/uL (ref 0.7–4.0)
MCHC: 34.2 g/dL (ref 30.0–36.0)
MCV: 95.8 fL (ref 78.0–100.0)
Monocytes Absolute: 0.7 10*3/uL (ref 0.1–1.0)
Neutro Abs: 2.3 10*3/uL (ref 1.4–7.7)
Potassium: 4.3 meq/L (ref 3.5–5.1)
RBC: 4.22 M/uL (ref 4.22–5.81)

## 2010-08-16 ENCOUNTER — Telehealth: Payer: Self-pay | Admitting: Cardiology

## 2010-08-16 ENCOUNTER — Telehealth: Payer: Self-pay | Admitting: Internal Medicine

## 2010-08-17 ENCOUNTER — Telehealth: Payer: Self-pay | Admitting: Physician Assistant

## 2010-08-17 ENCOUNTER — Encounter: Payer: Self-pay | Admitting: Physician Assistant

## 2010-08-17 ENCOUNTER — Ambulatory Visit: Payer: Self-pay

## 2010-08-17 DIAGNOSIS — R079 Chest pain, unspecified: Secondary | ICD-10-CM | POA: Insufficient documentation

## 2010-08-17 DIAGNOSIS — M79609 Pain in unspecified limb: Secondary | ICD-10-CM | POA: Insufficient documentation

## 2010-08-18 LAB — CONVERTED CEMR LAB: Troponin I: <0.01 ng/mL (ref <0.06)

## 2010-09-21 ENCOUNTER — Ambulatory Visit
Admission: RE | Admit: 2010-09-21 | Discharge: 2010-09-21 | Payer: Self-pay | Source: Home / Self Care | Attending: Internal Medicine | Admitting: Internal Medicine

## 2010-09-21 ENCOUNTER — Encounter: Payer: Self-pay | Admitting: Internal Medicine

## 2010-09-27 ENCOUNTER — Ambulatory Visit: Admit: 2010-09-27 | Payer: Self-pay | Admitting: Cardiology

## 2010-10-04 NOTE — Progress Notes (Signed)
Summary: afib  Phone Note Call from Patient Call back at [phone]   Caller: Patient Reason for Call: Talk to Nurse Summary of Call: pt is in Afib Initial call taken by: Migdalia Dk,  October 19, 2009 9:09 AM  Follow-up for Phone Call        HR is in the 70's at this point  says when he in SR his HR stays in the 60's.  will be here tomorrow for Coumadin and will need an EKG at that time. Dennis Bast, RN, BSN  October 19, 2009 10:17 AM

## 2010-10-04 NOTE — Assessment & Plan Note (Signed)
Summary: 3 MOMTH/D.MILLER   Visit Type:  Follow-up Referring Provider:  Valera Castle, MD Primary Provider:  Olivia Canter MD   History of Present Illness: The patient presents today for routine electrophysiology followup. He reports doing very well since last being seen in our clinic.   He has had no further afib since his cardioversion 12/10.  He now has a new cellulitis on his R hand, which he attributes to a "bug bite".  He is on doxycycline. The patient denies symptoms of palpitations, chest pain, shortness of breath, orthopnea, PND, lower extremity edema, dizziness, presyncope, syncope, or neurologic sequela. The patient is tolerating medications without difficulties and is otherwise without complaint today.   Current Medications (verified): 1)  Simvastatin 20 Mg Tabs (Simvastatin) .Marland Kitchen.. 1 Tab Once Daily 2)  Lisinopril 5 Mg Tabs (Lisinopril) .Marland Kitchen.. 1 Tab Once Daily 3)  Aspirin 81 Mg Tbec (Aspirin) .... Take One Tablet By Mouth Daily 4)  Carvedilol 12.5 Mg Tabs (Carvedilol) .... Take One Tablet By Mouth Twice A Day 5)  Fish Oil 1000 Mg Caps (Omega-3 Fatty Acids) .... 2 Cap Once Daily 6)  Fiber Supplement .... Use As Directed Once Daily 7)  Nitroglycerin 0.4 Mg Subl (Nitroglycerin) .... One Tablet Under Tongue Every 5 Minutes As Needed For Chest Pain---May Repeat Times Three 8)  Warfarin Sodium 4 Mg Tabs (Warfarin Sodium) .... Use As Directed By Anticoagulation Clinic 9)  Flomax 0.4 Mg Caps (Tamsulosin Hcl) .... Take 1 Tablet Daily 10)  Multivitamins   Tabs (Multiple Vitamin) .... Take One Tablet By Mouth Once Daily.  Allergies (verified): No Known Drug Allergies  Past History:  Past Medical History: Reviewed history from 05/05/2009 and no changes required. Persistent Atrial Fibrillation s/p ablation CAD  s/p PCI x3 2006 Cerebrovascular Disease Diabetes Type 2 Hyperlipidemia Hypertension Prior morbid obesity, has lost 120 lbs with lifestyle modification OSA, improved with weight  loss  Past Surgical History: Reviewed history from 05/05/2009 and no changes required. eye surgery at 35 months of age descending testicle surgery at age 29 afib ablation 02/08/09  Vital Signs:  Patient profile:   50 year old male Height:      72 inches Weight:      184 pounds BMI:     25.05 Pulse rate:   68 / minute BP sitting:   112 / 90  (left arm)  Vitals Entered By: Laurance Flatten CMA (December 29, 2009 9:36 AM)  Physical Exam  General:  Well developed, well nourished, in no acute distress. Head:  normocephalic and atraumatic Eyes:  PERRLA/EOM intact; conjunctiva and lids normal. Mouth:  Teeth, gums and palate normal. Oral mucosa normal. Neck:  Neck supple, no JVD. No masses, thyromegaly or abnormal cervical nodes. Lungs:  Clear bilaterally to auscultation and percussion. Heart:  Non-displaced PMI, chest non-tender; regular rate and rhythm, S1, S2 without murmurs, rubs or gallops. Carotid upstroke normal, no bruit. Normal abdominal aortic size, no bruits. Femorals normal pulses, no bruits. Pedals normal pulses. No edema, no varicosities. Abdomen:  Bowel sounds positive; abdomen soft and non-tender without masses, organomegaly, or hernias noted. No hepatosplenomegaly. Msk:  Back normal, normal gait. Muscle strength and tone normal. Pulses:  pulses normal in all 4 extremities Extremities:  No clubbing or cyanosis. Neurologic:  Alert and oriented x 3. Skin:  Intact without lesions or rashes. Cervical Nodes:  no significant adenopathy Psych:  Normal affect.   EKG  Procedure date:  12/29/2009  Findings:      sinus rhythm 68 bpm, otherwise  normal ekg  Impression & Recommendations:  Problem # 1:  ATRIAL FIBRILLATION (ICD-427.31) Doing well without further afib. He has had only 1 episode of afib since his ablation, which was precipitated by a cellulitis and antibiotics. No further afib since that time. Today, I offered pradaxa as an alternative to coumadin.  He is clear in his  decision to continue coumadin at this time.  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  If possitive, he would benefit from mupiricin and hibiclins treatment.  Patient Instructions: 1)  Your physician recommends that you schedule a follow-up appointment in: 3 months with Dr wall and 6 months with Dr [name]

## 2010-10-04 NOTE — Letter (Signed)
Summary: Generic Letter  [facility], Main Office  [address]   [place], [place] [address]   Phone: [phone]  Fax: [phone]        April 25, 2010 MRN: [id number]    Jahmez Flegal [address] [place], [place]  [address]    To Whom May Concern,     Mr. Dealmeida is going to be having a heart procedure on 05/25/10.  He will be out of work for one week.  He will return to work on [employer] 06/01/10.  For further questions please call [phone].    Sincerely,   Dr Hillis Range, MD/Kelly Judd Gaudier

## 2010-10-04 NOTE — Medication Information (Signed)
Summary: rov/sp  Anticoagulant Therapy  Managed by: Reina Fuse, PharmD Referring MD: Valera Castle MD PCP: Olivia Canter MD Supervising MD: Eden Emms MD, Theron Arista Indication 1: Atrial Fibrillation (ICD-427.31) Indication 2: Mitral Valve Regurgitation (ICD-Regurg) Lab Used: [facility] Site: [facility] INR POC 2.7 INR RANGE 2 - 3  Dietary changes: no    Health status changes: no    Bleeding/hemorrhagic complications: no    Recent/future hospitalizations: no    Any changes in medication regimen? no    Recent/future dental: no  Any missed doses?: no       Is patient compliant with meds? yes      Comments: Ablation schedule 11/11. Weekly INRs checked.  Current Medications (verified): 1)  Simvastatin 20 Mg Tabs (Simvastatin) .Marland Kitchen.. 1 Tab Once Daily 2)  Lisinopril 5 Mg Tabs (Lisinopril) .Marland Kitchen.. 1 Tab Once Daily 3)  Aspirin 81 Mg Tbec (Aspirin) .... Take One Tablet By Mouth Daily 4)  Carvedilol 25 Mg Tabs (Carvedilol) .... One By Mouth Two Times A Day 5)  Fish Oil 1000 Mg Caps (Omega-3 Fatty Acids) .... 2 Cap Once Daily 6)  Fiber Supplement .... Use As Directed Once Daily 7)  Nitroglycerin 0.4 Mg Subl (Nitroglycerin) .... One Tablet Under Tongue Every 5 Minutes As Needed For Chest Pain---May Repeat Times Three 8)  Warfarin Sodium 4 Mg Tabs (Warfarin Sodium) .... Use As Directed By Anticoagulation Clinic 9)  Flomax 0.4 Mg Caps (Tamsulosin Hcl) .... Take 1 Tablet Daily 10)  Multivitamins   Tabs (Multiple Vitamin) .... Take One Tablet By Mouth Once Daily. 11)  Cardizem 30 Mg Tabs (Diltiazem Hcl) .... One Tablet By Mouth Every 6 Hours For Break Through Afib  Allergies (verified): No Known Drug Allergies  Anticoagulation Management History:      The patient is taking warfarin and comes in today for a routine follow up visit.  Negative risk factors for bleeding include an age less than 36 years old.  The bleeding index is 'low risk'.  Positive CHADS2 values include History of HTN.  Negative  CHADS2 values include Age > 40 years old.  The start date was 10/16/2006.  His last INR was 2.4 ratio.  Anticoagulation responsible provider: Eden Emms MD, Theron Arista.  INR POC: 2.7.  Cuvette Lot#: [id number].  Exp: 07/2011.    Anticoagulation Management Assessment/Plan:      The patient's current anticoagulation dose is Warfarin sodium 4 mg tabs: Use as directed by Anticoagulation Clinic.  The target INR is 2 - 3.  The next INR is due 07/06/2010.  Anticoagulation instructions were given to patient.  Results were reviewed/authorized by Reina Fuse, PharmD.  He was notified by Reina Fuse PharmD.         Prior Anticoagulation Instructions: INR 3.0  Take 1 1/2 tablets tomorrow and Friday then resume same dose of 1 1/2 tablets every day except 2 tablets on Monday, Wednesday and Friday.  Recheck INR in 1 week.   Current Anticoagulation Instructions: INR 2.7  Continue taking Coumadin 8 mg ( 2 tabs) on M, W, F and Coumadin 6 mg (1.5 tabs) on Su, Tu, Th, Sa. Return to clinic in 1 weeks.

## 2010-10-04 NOTE — Letter (Signed)
Summary: ELectrophysiology/Ablation Procedure Instructions  [employer], Main Office  [address]   [place], [place] [address]   Phone: [phone]  Fax: [phone]     Electrophysiology/Ablation Procedure Instructions    You are scheduled for a(n) afib ablation on 05/26/10 at 7:30am with Dr. [name].  1.  Please come to the Short Stay Center at [facility] at 5:30am on the day of your procedure.  2.  Come prepared to stay overnight.   Please bring your insurance cards and a list of your medications.  3.  Come to the [place] office on 05/19/10 at          for lab work.  .  You do not have to be fasting.  4.  Do not have anything to eat or drink after midnight the night before your procedure.  5.  Do NOT take these medications for ____days before your procedure unless otherwise instructed:  ___________________.  All of your remaining medications may be taken with a small amount of water.  6.  Educational material received:  Ablation   * Occasionally, EP studies and ablations can become lengthy.  Please make your family aware of this before your procedure starts.  Average time ranges from 2-8 hours for EP studies/ablations.  Your physician will locate your family after the procedure with the results.  * If you have any questions after you get home, please call the office at [phone]. Anselm Pancoast  TEE-- on 05/25/10 with Dr Shirlee Latch at 11:00am  [facility] at 10:00am  Nothing to eat or drink after midnight  CT--05/16/10  [place] gave instructions and time

## 2010-10-04 NOTE — Medication Information (Signed)
Summary: rov/sl  Anticoagulant Therapy  Managed by: Eda Keys, PharmD Referring MD: Valera Castle MD PCP: Olivia Canter MD Supervising MD: Eden Emms MD, Theron Arista Indication 1: Atrial Fibrillation (ICD-427.31) Indication 2: Mitral Valve Regurgitation (ICD-Regurg) Lab Used: [facility] Site: [facility] INR POC 2.3 INR RANGE 2 - 3  Dietary changes: no    Health status changes: no    Bleeding/hemorrhagic complications: no    Recent/future hospitalizations: no    Any changes in medication regimen? no    Recent/future dental: no  Any missed doses?: no       Is patient compliant with meds? yes      Comments: Ablation by Dr. [name] scheduled for Sept 22nd.    Current Medications (verified): 1)  Simvastatin 20 Mg Tabs (Simvastatin) .Marland Kitchen.. 1 Tab Once Daily 2)  Lisinopril 5 Mg Tabs (Lisinopril) .Marland Kitchen.. 1 Tab Once Daily 3)  Aspirin 81 Mg Tbec (Aspirin) .... Take One Tablet By Mouth Daily 4)  Carvedilol 25 Mg Tabs (Carvedilol) .... One By Mouth Two Times A Day 5)  Fish Oil 1000 Mg Caps (Omega-3 Fatty Acids) .... 2 Cap Once Daily 6)  Fiber Supplement .... Use As Directed Once Daily 7)  Nitroglycerin 0.4 Mg Subl (Nitroglycerin) .... One Tablet Under Tongue Every 5 Minutes As Needed For Chest Pain---May Repeat Times Three 8)  Warfarin Sodium 4 Mg Tabs (Warfarin Sodium) .... Use As Directed By Anticoagulation Clinic 9)  Flomax 0.4 Mg Caps (Tamsulosin Hcl) .... Take 1 Tablet Daily 10)  Multivitamins   Tabs (Multiple Vitamin) .... Take One Tablet By Mouth Once Daily. 11)  Cardizem 30 Mg Tabs (Diltiazem Hcl) .... One Tablet By Mouth Every 6 Hours For Break Through Afib  Allergies (verified): No Known Drug Allergies  Anticoagulation Management History:      The patient is taking warfarin and comes in today for a routine follow up visit.  Negative risk factors for bleeding include an age less than 36 years old.  The bleeding index is 'low risk'.  Positive CHADS2 values include History of HTN.   Negative CHADS2 values include Age > 41 years old.  The start date was 10/16/2006.  His last INR was 2.4 ratio.  Anticoagulation responsible provider: Eden Emms MD, Theron Arista.  INR POC: 2.3.  Cuvette Lot#: [id number].  Exp: 06/2011.    Anticoagulation Management Assessment/Plan:      The patient's current anticoagulation dose is Warfarin sodium 4 mg tabs: Use as directed by Anticoagulation Clinic.  The target INR is 2 - 3.  The next INR is due 05/18/2010.  Anticoagulation instructions were given to patient.  Results were reviewed/authorized by Eda Keys, PharmD.  He was notified by Eda Keys.         Prior Anticoagulation Instructions: INR 2.6  Continue taking Coumadin 1.5 tabs (6 mg) on Sun, Tues, Thur, Sat and Coumadin 2 tabs (8 mg) on Mon, Wed, Fri. Return to clinic in 1 week.  Current Anticoagulation Instructions: INR 2.3  Continue taking 2 tablets on Monday, Wednesday, and Friday and 1.5 tablets all other days.  Return to clinic in 1 week.

## 2010-10-04 NOTE — Letter (Signed)
Summary: Return To Work  [employer], Main Office  [address]   [place], [place] [address]   Phone: [phone]  Fax: [phone]    07/11/2010  TO: WHOM IT MAY CONCERN   RE: [name] [address] [place] [address]   The above named individual is under my medical care and will need to be out of work for a procedure starting 07/14/10 and will return to work on 07/22/10.  If you have any further questions or need additional information, please call.     Sincerely,    Dr. Fayrene Fearing Allred/Kelly Darl Pikes, RN, BSN

## 2010-10-04 NOTE — Medication Information (Signed)
Summary: rov/eh  Anticoagulant Therapy  Managed by: Cloyde Reams, RN, BSN Referring MD: Valera Castle MD PCP: Olivia Canter MD Supervising MD: Eden Emms MD, Theron Arista Indication 1: Atrial Fibrillation (ICD-427.31) Indication 2: Mitral Valve Regurgitation (ICD-Regurg) Lab Used: [facility] Site: [facility] INR POC 1.7 INR RANGE 2 - 3  Dietary changes: no    Health status changes: no    Bleeding/hemorrhagic complications: no    Recent/future hospitalizations: no    Any changes in medication regimen? no    Recent/future dental: no  Any missed doses?: no       Is patient compliant with meds? yes       Allergies (verified): No Known Drug Allergies  Anticoagulation Management History:      The patient is taking warfarin and comes in today for a routine follow up visit.  Negative risk factors for bleeding include an age less than 81 years old.  The bleeding index is 'low risk'.  Positive CHADS2 values include History of HTN.  Negative CHADS2 values include Age > 75 years old.  The start date was 10/16/2006.  His last INR was 2.4 ratio.  Anticoagulation responsible provider: Eden Emms MD, Theron Arista.  INR POC: 1.7.  Cuvette Lot#: [id number].  Exp: 12/2010.    Anticoagulation Management Assessment/Plan:      The patient's current anticoagulation dose is Warfarin sodium 4 mg tabs: Use as directed by Anticoagulation Clinic.  The target INR is 2 - 3.  The next INR is due 10/20/2009.  Anticoagulation instructions were given to patient.  Results were reviewed/authorized by Cloyde Reams, RN, BSN.  He was notified by Cloyde Reams RN.         Prior Anticoagulation Instructions: INR 1.7  Take an extra 0.5 tablet today then increase dose to 1.5 tablets daily except 1 tablet on Sundays. Recheck in 2 weeks.  Current Anticoagulation Instructions: INR 1.7  Take 2 tablets today then start taking 1.5 tablets daily.  Recheck in 2 weeks.

## 2010-10-04 NOTE — Medication Information (Signed)
Summary: rov/sp  Anticoagulant Therapy  Managed by: Weston Brass, PharmD Referring MD: Valera Castle MD PCP: Olivia Canter MD Supervising MD: Nahser  Indication 1: Atrial Fibrillation (ICD-427.31) Indication 2: Mitral Valve Regurgitation (ICD-Regurg) Lab Used: [facility] Site: [facility] INR POC 2.8 INR RANGE 2 - 3  Dietary changes: no    Health status changes: no    Bleeding/hemorrhagic complications: no    Recent/future hospitalizations: yes       Details: pending ablation this Friday  Any changes in medication regimen? no    Recent/future dental: no  Any missed doses?: no       Is patient compliant with meds? yes       Allergies: No Known Drug Allergies  Anticoagulation Management History:      The patient is taking warfarin and comes in today for a routine follow up visit.  Negative risk factors for bleeding include an age less than 21 years old.  The bleeding index is 'low risk'.  Positive CHADS2 values include History of HTN.  Negative CHADS2 values include Age > 64 years old.  The start date was 10/16/2006.  His last INR was 2.4 ratio.  Anticoagulation responsible provider: Nahser .  INR POC: 2.8.  Cuvette Lot#: [id number].  Exp: 07/2011.    Anticoagulation Management Assessment/Plan:      The patient's current anticoagulation dose is Warfarin sodium 4 mg tabs: Use as directed by Anticoagulation Clinic.  The target INR is 2 - 3.  The next INR is due 07/26/2010.  Anticoagulation instructions were given to patient.  Results were reviewed/authorized by Weston Brass, PharmD.  He was notified by Weston Brass PharmD.         Prior Anticoagulation Instructions: INR 2.2  Continue same dose of 1 1/2 tablets every day except 2 tablets on Monday, Wednesday and Friday.  Recheck INR in 1 week.   Current Anticoagulation Instructions: INR 2.8  Continue same dose of 1 1/2 tablets every day except 2 tablets on Monday, Wednesday and Friday.  Recheck INR in 2 weeks.

## 2010-10-04 NOTE — Medication Information (Signed)
Summary: Coumadin Clinic  Anticoagulant Therapy  Managed by: Inactive Referring MD: Valera Castle MD PCP: Olivia Canter MD Supervising MD: Shirlee Latch MD, Shylee Durrett Indication 1: Atrial Fibrillation (ICD-427.31) Indication 2: Mitral Valve Regurgitation (ICD-Regurg) Lab Used: [facility] Site: [facility] INR RANGE 2 - 3          Comments: Pt switched to Pradaxa.   Allergies: No Known Drug Allergies  Anticoagulation Management History:      Negative risk factors for bleeding include an age less than 50 years old.  The bleeding index is 'low risk'.  Positive CHADS2 values include History of HTN.  Negative CHADS2 values include Age > 50 years old.  The start date was 10/16/2006.  His last INR was 3.0 ratio.  Anticoagulation responsible provider: Shirlee Latch MD, Etter Royall.  Exp: 07/2011.    Anticoagulation Management Assessment/Plan:      The patient's current anticoagulation dose is Warfarin sodium 4 mg tabs: Use as directed by Anticoagulation Clinic.  The target INR is 2 - 3.  The next INR is due 07/26/2010.  Anticoagulation instructions were given to patient.  Results were reviewed/authorized by Inactive.         Prior Anticoagulation Instructions: INR 1.4  Start Pradaxa 150mg  twice daily.

## 2010-10-04 NOTE — Progress Notes (Signed)
Summary: reschedule ablation  Phone Note Call from Patient Call back at [phone]   Caller: pt Summary of Call: pt needs to know when we are going to reschedule his ablation for. Initial call taken by: Faythe Ghee,  May 30, 2010 12:49 PM  Follow-up for Phone Call        [phone] resch procedure to 11/1// due to INR being supratherapeutic faxed today Dennis Bast, RN, BSN  May 31, 2010 9:46 AM

## 2010-10-04 NOTE — Progress Notes (Signed)
Summary: Cardiac CT results  Phone Note Call from Patient   Caller: Patient Summary of Call: The pt's call came directly to ext [phone]. He was calling for the results of his cardiac CT done yesterday. I explained to the pt that Dr. [name] has not seen the final report on this. I gave him the prelimanary findings. He was concerned that his a-fib might be related to his mitral regurgitation. I explained I was not certain if this was Dr. Jenel Lucks thought or not. He will be in tomorrow for an INR check pending ablation on 9/22. I will forward this message to [employer].  Initial call taken by: Sherri Rad, RN, BSN,  May 17, 2010 9:50 AM  Follow-up for Phone Call        pt aware of results and Dr [name] says not a real change  Previous EF 50-55%  now by CT 49%. Dennis Bast, RN, BSN  May 18, 2010 9:58 AM

## 2010-10-04 NOTE — Medication Information (Signed)
Summary: rov/sp  Anticoagulant Therapy  Managed by: Weston Brass, PharmD Referring MD: Valera Castle MD PCP: Olivia Canter MD Supervising MD: Myrtis Ser MD, Tinnie Gens Indication 1: Atrial Fibrillation (ICD-427.31) Indication 2: Mitral Valve Regurgitation (ICD-Regurg) Lab Used: [facility] Site: [facility] INR POC 1.5 INR RANGE 2 - 3  Dietary changes: no    Health status changes: no    Bleeding/hemorrhagic complications: no    Recent/future hospitalizations: yes       Details: discharged from hospital for right hand cellulitis   Any changes in medication regimen? yes       Details: was on clindamycin for 10 days- finished on Monday   Recent/future dental: no  Any missed doses?: no       Is patient compliant with meds? yes      Comments: INR was slightly low on discharge- 1.9  Allergies: No Known Drug Allergies  Anticoagulation Management History:      The patient is taking warfarin and comes in today for a routine follow up visit.  Negative risk factors for bleeding include an age less than 5 years old.  The bleeding index is 'low risk'.  Positive CHADS2 values include History of HTN.  Negative CHADS2 values include Age > 51 years old.  The start date was 10/16/2006.  His last INR was 2.4 ratio.  Anticoagulation responsible provider: Myrtis Ser MD, Tinnie Gens.  INR POC: 1.5.  Cuvette Lot#: [id number].  Exp: 04/2011.    Anticoagulation Management Assessment/Plan:      The patient's current anticoagulation dose is Warfarin sodium 4 mg tabs: Use as directed by Anticoagulation Clinic.  The target INR is 2 - 3.  The next INR is due 01/19/2010.  Anticoagulation instructions were given to patient.  Results were reviewed/authorized by Weston Brass, PharmD.  He was notified by Weston Brass PharmD.         Prior Anticoagulation Instructions: INR 2.1  Continue same dose of 1 1/2 tablets (6mg ) every day   Current Anticoagulation Instructions: INR 1.5  Take extra 1/2 tablet today, 2 tablets tomorrow then  resume same dose of 1 1/2 tablets every day.

## 2010-10-04 NOTE — Medication Information (Signed)
Summary: rov/tm  Anticoagulant Therapy  Managed by: Reina Fuse, PharmD Referring MD: Valera Castle MD PCP: Olivia Canter MD Supervising MD: Jens Som MD, Arlys John Indication 1: Atrial Fibrillation (ICD-427.31) Indication 2: Mitral Valve Regurgitation (ICD-Regurg) Lab Used: [facility] Site: [facility] INR POC 2.6 INR RANGE 2 - 3  Dietary changes: no    Health status changes: no    Bleeding/hemorrhagic complications: no    Recent/future hospitalizations: no    Any changes in medication regimen? no    Recent/future dental: no  Any missed doses?: no       Is patient compliant with meds? yes      Comments: Planned ablation in 3 weeks. Weekly INR checks scheduled.  Allergies: No Known Drug Allergies  Anticoagulation Management History:      The patient is taking warfarin and comes in today for a routine follow up visit.  Negative risk factors for bleeding include an age less than 12 years old.  The bleeding index is 'low risk'.  Positive CHADS2 values include History of HTN.  Negative CHADS2 values include Age > 17 years old.  The start date was 10/16/2006.  His last INR was 2.4 ratio.  Anticoagulation responsible provider: Jens Som MD, Arlys John.  INR POC: 2.6.  Cuvette Lot#: [id number].  Exp: 07/2011.    Anticoagulation Management Assessment/Plan:      The patient's current anticoagulation dose is Warfarin sodium 4 mg tabs: Use as directed by Anticoagulation Clinic.  The target INR is 2 - 3.  The next INR is due 05/11/2010.  Anticoagulation instructions were given to patient.  Results were reviewed/authorized by Reina Fuse, PharmD.  He was notified by Reina Fuse PharmD.         Prior Anticoagulation Instructions: INR 2.2  Take 2.5 tablets today, then resume 1.5 tablets daily except 2 tablets on Mondays, Wednesdays, and Fridays.  Recheck in 1 week. Pending Ablation on 05/26/10.  Current Anticoagulation Instructions: INR 2.6  Continue taking Coumadin 1.5 tabs (6 mg) on Sun, Tues,  Thur, Sat and Coumadin 2 tabs (8 mg) on Mon, Wed, Fri. Return to clinic in 1 week.

## 2010-10-04 NOTE — Medication Information (Signed)
Summary: rov/eac  Anticoagulant Therapy  Managed by: Lyna Poser, PharmD Referring MD: Valera Castle MD PCP: Olivia Canter MD Supervising MD: Myrtis Ser MD,Mahesh Indication 1: Atrial Fibrillation (ICD-427.31) Indication 2: Mitral Valve Regurgitation (ICD-Regurg) Lab Used: [facility] Site: [facility] INR POC 2.9 INR RANGE 2 - 3  Dietary changes: no    Health status changes: no    Bleeding/hemorrhagic complications: no    Recent/future hospitalizations: yes       Details: ablation scheduled for 22nd  Any changes in medication regimen? no    Recent/future dental: no  Any missed doses?: no       Is patient compliant with meds? yes       Allergies: No Known Drug Allergies  Anticoagulation Management History:      The patient is taking warfarin and comes in today for a routine follow up visit.  Negative risk factors for bleeding include an age less than 58 years old.  The bleeding index is 'low risk'.  Positive CHADS2 values include History of HTN.  Negative CHADS2 values include Age > 29 years old.  The start date was 10/16/2006.  His last INR was 2.4 ratio.  Anticoagulation responsible provider: Myrtis Ser MD,Gianni.  INR POC: 2.9.  Exp: 06/2011.    Anticoagulation Management Assessment/Plan:      The patient's current anticoagulation dose is Warfarin sodium 4 mg tabs: Use as directed by Anticoagulation Clinic.  The target INR is 2 - 3.  The next INR is due 05/31/2010.  Anticoagulation instructions were given to patient.  Results were reviewed/authorized by Lyna Poser, PharmD.         Prior Anticoagulation Instructions: INR 2.3  Continue taking 2 tablets on Monday, Wednesday, and Friday and 1.5 tablets all other days.  Return to clinic in 1 week.  Current Anticoagulation Instructions: INR 2.9 Continue same dosages. No changes. We will see you after your ablation.

## 2010-10-04 NOTE — Medication Information (Signed)
Summary: rov/eac  Anticoagulant Therapy  Managed by: Eda Keys, PharmD Referring MD: Valera Castle MD PCP: Olivia Canter MD Supervising MD: Jens Som MD, Arlys John Indication 1: Atrial Fibrillation (ICD-427.31) Indication 2: Mitral Valve Regurgitation (ICD-Regurg) Lab Used: [facility] Site: [facility] INR POC 2.0 INR RANGE 2 - 3  Dietary changes: no    Health status changes: no    Bleeding/hemorrhagic complications: no    Recent/future hospitalizations: no    Any changes in medication regimen? no    Recent/future dental: no  Any missed doses?: no       Is patient compliant with meds? yes       Allergies: No Known Drug Allergies  Anticoagulation Management History:      The patient is taking warfarin and comes in today for a routine follow up visit.  Negative risk factors for bleeding include an age less than 40 years old.  The bleeding index is 'low risk'.  Positive CHADS2 values include History of HTN.  Negative CHADS2 values include Age > 70 years old.  The start date was 10/16/2006.  His last INR was 2.4 ratio.  Anticoagulation responsible provider: Jens Som MD, Arlys John.  INR POC: 2.0.  Cuvette Lot#: [id number].  Exp: 01/2011.    Anticoagulation Management Assessment/Plan:      The patient's current anticoagulation dose is Warfarin sodium 4 mg tabs: Use as directed by Anticoagulation Clinic.  The target INR is 2 - 3.  The next INR is due 12/15/2009.  Anticoagulation instructions were given to patient.  Results were reviewed/authorized by Eda Keys, PharmD.  He was notified by Eda Keys.         Prior Anticoagulation Instructions: INR 2.2  Continue current dosing schedule.  Take 1.5 tablets daily. Return to clinic in 4 weeks.  Current Anticoagulation Instructions: INR 2.0  Take 2 tablets today.  Then return to normal dosing schedule of 1.5 tablets daily.  Return to clinic in 4 weeks.  Mr. Kye has been running low...if low next time consider increasing  total weekly dose.

## 2010-10-04 NOTE — Medication Information (Signed)
Summary: rov/sl  Anticoagulant Therapy  Managed by: Weston Brass, PharmD Referring MD: Valera Castle MD PCP: Olivia Canter MD Supervising MD: Eden Emms MD, Theron Arista Indication 1: Atrial Fibrillation (ICD-427.31) Indication 2: Mitral Valve Regurgitation (ICD-Regurg) Lab Used: [facility] Site: [facility] INR POC 2.2 INR RANGE 2 - 3  Dietary changes: no    Health status changes: no    Bleeding/hemorrhagic complications: no    Recent/future hospitalizations: no    Any changes in medication regimen? no    Recent/future dental: no  Any missed doses?: no       Is patient compliant with meds? yes       Allergies: No Known Drug Allergies  Anticoagulation Management History:      The patient is taking warfarin and comes in today for a routine follow up visit.  Negative risk factors for bleeding include an age less than 5 years old.  The bleeding index is 'low risk'.  Positive CHADS2 values include History of HTN.  Negative CHADS2 values include Age > 38 years old.  The start date was 10/16/2006.  His last INR was 2.4 ratio.  Anticoagulation responsible provider: Eden Emms MD, Theron Arista.  INR POC: 2.2.  Cuvette Lot#: [id number].  Exp: 07/2011.    Anticoagulation Management Assessment/Plan:      The patient's current anticoagulation dose is Warfarin sodium 4 mg tabs: Use as directed by Anticoagulation Clinic.  The target INR is 2 - 3.  The next INR is due 06/22/2010.  Anticoagulation instructions were given to patient.  Results were reviewed/authorized by Weston Brass, PharmD.  He was notified by Ilean Skill D candidate.         Prior Anticoagulation Instructions: INR 3.2  Today, Wednesday, October 5th, take Coumadin 0.5 tab (2 mg). Then continue taking Coumadin 1.5 tabs (6 mg) on all days and then Coumadin 2 tabs (8 mg) on Mondays and Fridays.  Return to clinic in 1 week.    Current Anticoagulation Instructions: INR 2.2   Take 2 tablets on Monday, Wednesday, and Friday, then 1 1/2  tablets on Sunday, Tuesday, Thursday, and Saturday. Recheck in 1 week.

## 2010-10-04 NOTE — Medication Information (Signed)
Summary: rov/sp  Anticoagulant Therapy  Managed by: Weston Brass, PharmD Referring MD: Valera Castle MD PCP: Olivia Canter MD Supervising MD: Jens Som MD, Arlys John Indication 1: Atrial Fibrillation (ICD-427.31) Indication 2: Mitral Valve Regurgitation (ICD-Regurg) Lab Used: [facility] Site: [facility] INR POC 1.8 INR RANGE 2 - 3  Dietary changes: no    Health status changes: no    Bleeding/hemorrhagic complications: no    Recent/future hospitalizations: no    Any changes in medication regimen? no    Recent/future dental: no  Any missed doses?: no       Is patient compliant with meds? yes       Allergies: No Known Drug Allergies  Anticoagulation Management History:      The patient is taking warfarin and comes in today for a routine follow up visit.  Negative risk factors for bleeding include an age less than 21 years old.  The bleeding index is 'low risk'.  Positive CHADS2 values include History of HTN.  Negative CHADS2 values include Age > 64 years old.  The start date was 10/16/2006.  His last INR was 2.4 ratio.  Anticoagulation responsible provider: Jens Som MD, Arlys John.  INR POC: 1.8.  Cuvette Lot#: [id number].  Exp: 04/2011.    Anticoagulation Management Assessment/Plan:      The patient's current anticoagulation dose is Warfarin sodium 4 mg tabs: Use as directed by Anticoagulation Clinic.  The target INR is 2 - 3.  The next INR is due 02/03/2010.  Anticoagulation instructions were given to patient.  Results were reviewed/authorized by Weston Brass, PharmD.  He was notified by Weston Brass PharmD.         Prior Anticoagulation Instructions: INR 1.5  Take extra 1/2 tablet today, 2 tablets tomorrow then resume same dose of 1 1/2 tablets every day.   Current Anticoagulation Instructions: INR 1.8  Increase dose to 1 1/2 tablet every day except 2 tablets on Thursday.

## 2010-10-04 NOTE — Medication Information (Signed)
Summary: rov/mw  Anticoagulant Therapy  Managed by: Cloyde Reams, RN, BSN Referring MD: Valera Castle MD PCP: Olivia Canter MD Supervising MD: Myrtis Ser MD,Azarias Indication 1: Atrial Fibrillation (ICD-427.31) Indication 2: Mitral Valve Regurgitation (ICD-Regurg) Lab Used: [facility] Site: [facility] INR POC 3.2 INR RANGE 2 - 3  Dietary changes: no    Health status changes: no    Bleeding/hemorrhagic complications: no    Recent/future hospitalizations: no    Any changes in medication regimen? yes       Details: Started on Multaq  Recent/future dental: no  Any missed doses?: no       Is patient compliant with meds? yes      Comments: DCCV in hospital 05/26/10. INR on 3.4 in hospital.  Rescheduled ablation to 07/15/10.  Allergies: No Known Drug Allergies  Anticoagulation Management History:      The patient is taking warfarin and comes in today for a routine follow up visit.  Negative risk factors for bleeding include an age less than 36 years old.  The bleeding index is 'low risk'.  Positive CHADS2 values include History of HTN.  Negative CHADS2 values include Age > 43 years old.  The start date was 10/16/2006.  His last INR was 2.4 ratio.  Anticoagulation responsible provider: Myrtis Ser MD,Niccolo.  INR POC: 3.2.  Exp: 06/2011.    Anticoagulation Management Assessment/Plan:      The patient's current anticoagulation dose is Warfarin sodium 4 mg tabs: Use as directed by Anticoagulation Clinic.  The target INR is 2 - 3.  The next INR is due 06/08/2010.  Anticoagulation instructions were given to patient.  Results were reviewed/authorized by Cloyde Reams, RN, BSN.  He was notified by Cloyde Reams RN.         Prior Anticoagulation Instructions: INR 2.9 Continue same dosages. No changes. We will see you after your ablation.  Current Anticoagulation Instructions: INR 3.2  Start taking 1.5 tablets daily except 2 tablets on Mondays and Fridays.  Recheck in 1 week.

## 2010-10-04 NOTE — Assessment & Plan Note (Signed)
Summary: eph--pt went back in afib and had DCCV/kl   Visit Type:  hosp follow up Referring Provider:  Valera Castle, MD Primary Provider:  Olivia Canter MD  CC:  no complaints today.  History of Present Illness: The patient presents today for electrophysiology followup. He reports having an episode of afib last week, requiring cardioversion.  He is unaware of any triggers for this episode.  Otherwise, he has done remarkably well since his ablation off AAD therapy.  The patient denies symptoms of palpitations, chest pain, shortness of breath, orthopnea, PND, lower extremity edema, dizziness, presyncope, syncope, or neurologic sequela. The patient is tolerating medications without difficulties and is otherwise without complaint today.    Current Medications (verified): 1)  Simvastatin 20 Mg Tabs (Simvastatin) .Marland Kitchen.. 1 Tab Once Daily 2)  Lisinopril 5 Mg Tabs (Lisinopril) .Marland Kitchen.. 1 Tab Once Daily 3)  Aspirin 81 Mg Tbec (Aspirin) .... Take One Tablet By Mouth Daily 4)  Carvedilol 12.5 Mg Tabs (Carvedilol) .... Take One Tablet By Mouth Twice A Day 5)  Fish Oil 1000 Mg Caps (Omega-3 Fatty Acids) .... 2 Cap Once Daily 6)  Fiber Supplement .... Use As Directed Once Daily 7)  Nitroglycerin 0.4 Mg Subl (Nitroglycerin) .... One Tablet Under Tongue Every 5 Minutes As Needed For Chest Pain---May Repeat Times Three 8)  Warfarin Sodium 4 Mg Tabs (Warfarin Sodium) .... Use As Directed By Anticoagulation Clinic 9)  Flomax 0.4 Mg Caps (Tamsulosin Hcl) .... Take 1 Tablet Daily 10)  Multivitamins   Tabs (Multiple Vitamin) .... Take One Tablet By Mouth Once Daily.  Allergies (verified): No Known Drug Allergies  Past History:  Past Medical History: Reviewed history from 05/05/2009 and no changes required. Persistent Atrial Fibrillation s/p ablation CAD  s/p PCI x3 2006 Cerebrovascular Disease Diabetes Type 2 Hyperlipidemia Hypertension Prior morbid obesity, has lost 120 lbs with lifestyle modification OSA,  improved with weight loss  Past Surgical History: Reviewed history from 05/05/2009 and no changes required. eye surgery at 72 months of age descending testicle surgery at age 63 afib ablation 02/08/09  Social History: Reviewed history from 01/20/2009 and no changes required. Lives in [place] with spouse.  Full Time mortgage collections. Married.  Wife manages [employer]. Tobacco Use - No.  Alcohol Use - no Regular Exercise - yes Drug Use - no  Review of Systems       All systems are reviewed and negative except as listed in the HPI.   Vital Signs:  Patient profile:   50 year old male Height:      72 inches Weight:      179 pounds BMI:     24.36 Pulse rate:   76 / minute BP sitting:   106 / 60  (left arm) Cuff size:   large  Vitals Entered By: Caralee Ates CMA (April 20, 2010 8:51 AM)  Physical Exam  General:  Well developed, well nourished, in no acute distress. Head:  normocephalic and atraumatic Eyes:  PERRLA/EOM intact; conjunctiva and lids normal. Mouth:  Teeth, gums and palate normal. Oral mucosa normal. Neck:  Neck supple, no JVD. No masses, thyromegaly or abnormal cervical nodes. Lungs:  Clear bilaterally to auscultation and percussion. Heart:  Non-displaced PMI, chest non-tender; regular rate and rhythm, S1, S2 without murmurs, rubs or gallops. Carotid upstroke normal, no bruit. Normal abdominal aortic size, no bruits. Femorals normal pulses, no bruits. Pedals normal pulses. No edema, no varicosities. Abdomen:  Bowel sounds positive; abdomen soft and non-tender without masses, organomegaly, or  hernias noted. No hepatosplenomegaly. Msk:  Back normal, normal gait. Muscle strength and tone normal. Pulses:  pulses normal in all 4 extremities Extremities:  No clubbing or cyanosis. Neurologic:  Alert and oriented x 3. Psych:  Normal affect.   EKG  Procedure date:  04/20/2010  Findings:      sinus rhythm 76 bpm, PR 152, Qtc 407, otherwise normal  ekg  Impression & Recommendations:  Problem # 1:  ATRIAL FIBRILLATION (ICD-427.31) The patient has done very well s/p ablation off AAD.  He has had one recent episode of afib requiring cardioversion.  Therapeutic strategies for afib including medicine and ablation were discussed in detail with the patient today. At this point, I think that we should continue to observe the patient.  I do not want to expose him to the risks of AAD therapy at this time.  If his afib increases, then we should consider repeat catheter ablation.  We will provide cardizem 30mg  for as needed use.  Problem # 2:  COUMADIN THERAPY (ICD-V58.61) goal INR 2-3  Problem # 3:  CAD (ICD-414.00) no symptoms of ischemia he has scheduled follow up with Dr Daleen Squibb  His updated medication list for this problem includes:    Lisinopril 5 Mg Tabs (Lisinopril) .Marland Kitchen... 1 tab once daily    Aspirin 81 Mg Tbec (Aspirin) .Marland Kitchen... Take one tablet by mouth daily    Carvedilol 12.5 Mg Tabs (Carvedilol) .Marland Kitchen... Take one tablet by mouth twice a day    Nitroglycerin 0.4 Mg Subl (Nitroglycerin) ..... One tablet under tongue every 5 minutes as needed for chest pain---may repeat times three    Warfarin Sodium 4 Mg Tabs (Warfarin sodium) ..... Use as directed by anticoagulation clinic    Cardizem 30 Mg Tabs (Diltiazem hcl) ..... One tablet by mouth every 6 hours for break through afib  Other Orders: EKG w/ Interpretation (93000)  Patient Instructions: 1)  Your physician recommends that you schedule a follow-up appointment in: 6 months with Dr [name] 2)  Your physician has recommended you make the following change in your medication: start Cardizem 30mg -one pill every six hours as needed for break through afib Prescriptions: CARDIZEM 30 MG TABS (DILTIAZEM HCL) one tablet by mouth every 6 hours for break through afib  #30 x 1   Entered by:   Dennis Bast, RN, BSN   Authorized by:   Hillis Range, MD   Signed by:   Dennis Bast, RN, BSN on 04/20/2010    Method used:   Electronically to        [facility] [phone]* (retail)       [address] [place], [place]  [address]       Ph: [phone]       Fax: [phone]   RxID:   [id number]

## 2010-10-04 NOTE — Medication Information (Signed)
Summary: rov/ewj  Anticoagulant Therapy  Managed by: Reina Fuse, PharmD Referring MD: Valera Castle MD PCP: Olivia Canter MD Supervising MD: Leodis Sias Indication 1: Atrial Fibrillation (ICD-427.31) Indication 2: Mitral Valve Regurgitation (ICD-Regurg) Lab [facility]  Site: [facility] INR POC 3.2 INR RANGE 2 - 3  Dietary changes: yes       Details: Less leafy greens  Health status changes: no    Bleeding/hemorrhagic complications: no    Recent/future hospitalizations: yes       Details: Recent ablation planned, but INR subtherapeutic  Any changes in medication regimen? no    Recent/future dental: no  Any missed doses?: no       Is patient compliant with meds? yes      Comments: Multaq BID recently added two weeks ago. Recent ablation scheduled, but supratherapeutic INR. Ablation rescheduled for 11/11.  Current Medications (verified): 1)  Simvastatin 20 Mg Tabs (Simvastatin) .Marland Kitchen.. 1 Tab Once Daily 2)  Lisinopril 5 Mg Tabs (Lisinopril) .Marland Kitchen.. 1 Tab Once Daily 3)  Aspirin 81 Mg Tbec (Aspirin) .... Take One Tablet By Mouth Daily 4)  Carvedilol 25 Mg Tabs (Carvedilol) .... One By Mouth Two Times A Day 5)  Fish Oil 1000 Mg Caps (Omega-3 Fatty Acids) .... 2 Cap Once Daily 6)  Fiber Supplement .... Use As Directed Once Daily 7)  Nitroglycerin 0.4 Mg Subl (Nitroglycerin) .... One Tablet Under Tongue Every 5 Minutes As Needed For Chest Pain---May Repeat Times Three 8)  Warfarin Sodium 4 Mg Tabs (Warfarin Sodium) .... Use As Directed By Anticoagulation Clinic 9)  Flomax 0.4 Mg Caps (Tamsulosin Hcl) .... Take 1 Tablet Daily 10)  Multivitamins   Tabs (Multiple Vitamin) .... Take One Tablet By Mouth Once Daily. 11)  Cardizem 30 Mg Tabs (Diltiazem Hcl) .... One Tablet By Mouth Every 6 Hours For Break Through Afib  Allergies (verified): No Known Drug Allergies  Anticoagulation Management History:      The patient is taking warfarin and comes in today for a routine follow up  visit.  Negative risk factors for bleeding include an age less than 71 years old.  The bleeding index is 'low risk'.  Positive CHADS2 values include History of HTN.  Negative CHADS2 values include Age > 54 years old.  The start date was 10/16/2006.  His last INR was 2.4 ratio.  Anticoagulation responsible provider: Leodis Sias.  INR POC: 3.2.  Cuvette Lot#: [id number].  Exp: 06/2011.    Anticoagulation Management Assessment/Plan:      The patient's current anticoagulation dose is Warfarin sodium 4 mg tabs: Use as directed by Anticoagulation Clinic.  The target INR is 2 - 3.  The next INR is due 06/15/2010.  Anticoagulation instructions were given to patient.  Results were reviewed/authorized by Reina Fuse, PharmD.  He was notified by Reina Fuse PharmD.         Prior Anticoagulation Instructions: INR 3.2  Start taking 1.5 tablets daily except 2 tablets on Mondays and Fridays.  Recheck in 1 week.  Current Anticoagulation Instructions: INR 3.2  Today, Wednesday, October 5th, take Coumadin 0.5 tab (2 mg). Then continue taking Coumadin 1.5 tabs (6 mg) on all days and then Coumadin 2 tabs (8 mg) on Mondays and Fridays.  Return to clinic in 1 week.

## 2010-10-04 NOTE — Medication Information (Signed)
Summary: rov/eac  Anticoagulant Therapy  Managed by: Weston Brass, PharmD Referring MD: Valera Castle MD PCP: Olivia Canter MD Supervising MD: Eden Emms MD, Theron Arista Indication 1: Atrial Fibrillation (ICD-427.31) Indication 2: Mitral Valve Regurgitation (ICD-Regurg) Lab Used: [facility] Site: [facility] INR POC 2.1 INR RANGE 2 - 3  Dietary changes: no    Health status changes: no    Bleeding/hemorrhagic complications: no    Recent/future hospitalizations: no    Any changes in medication regimen? no    Recent/future dental: no  Any missed doses?: no       Is patient compliant with meds? yes       Allergies: No Known Drug Allergies  Anticoagulation Management History:      The patient is taking warfarin and comes in today for a routine follow up visit.  Negative risk factors for bleeding include an age less than 88 years old.  The bleeding index is 'low risk'.  Positive CHADS2 values include History of HTN.  Negative CHADS2 values include Age > 54 years old.  The start date was 10/16/2006.  His last INR was 2.4 ratio.  Anticoagulation responsible provider: Eden Emms MD, Theron Arista.  INR POC: 2.1.  Cuvette Lot#: [id number].  Exp: 01/2011.    Anticoagulation Management Assessment/Plan:      The patient's current anticoagulation dose is Warfarin sodium 4 mg tabs: Use as directed by Anticoagulation Clinic.  The target INR is 2 - 3.  The next INR is due 01/12/2010.  Anticoagulation instructions were given to patient.  Results were reviewed/authorized by Weston Brass, PharmD.  He was notified by Weston Brass PharmD.         Prior Anticoagulation Instructions: INR 2.0  Take 2 tablets today.  Then return to normal dosing schedule of 1.5 tablets daily.  Return to clinic in 4 weeks.  Mr. Pilley has been running low...if low next time consider increasing total weekly dose.  Current Anticoagulation Instructions: INR 2.1  Continue same dose of 1 1/2 tablets (6mg ) every day

## 2010-10-04 NOTE — Progress Notes (Signed)
Summary: rx simvastatin, lisinopril  Phone Note Refill Request Call back at [phone]   Refills Requested: Medication #1:  SIMVASTATIN 20 MG TABS 1 tab once daily   Supply Requested: 6 months  Medication #2:  LISINOPRIL 5 MG TABS 1 tab once daily   Supply Requested: 6 months [facility] [phone] in [place]   Method Requested: Fax to Local Pharmacy Initial call taken by: Migdalia Dk,  September 23, 2009 8:12 AM    Prescriptions: LISINOPRIL 5 MG TABS (LISINOPRIL) 1 tab once daily  #30 x 11   Entered by:   Danielle Rankin, CMA   Authorized by:   Gaylord Shih, MD, [facility]   Signed by:   Danielle Rankin, CMA on 09/23/2009   Method used:   Electronically to        [facility] [phone]* (retail)       [address] [place], [place]  [address]       Ph: [phone]       Fax: [phone]   RxID:   [id number] SIMVASTATIN 20 MG TABS (SIMVASTATIN) 1 tab once daily  #30 x 11   Entered by:   Danielle Rankin, CMA   Authorized by:   Gaylord Shih, MD, [facility]   Signed by:   Danielle Rankin, CMA on 09/23/2009   Method used:   Electronically to        [facility] [phone]* (retail)       [address] [place], [place]  [address]       Ph: [phone]       Fax: [phone]   RxID:   [id number]

## 2010-10-04 NOTE — Progress Notes (Signed)
Summary: Back in AFib - Kelly please call  Phone Note Call from Patient Call back at [facility] Phone [phone]   Caller: Patient Reason for Call: Talk to Doctor Summary of Call: Returned call from patient concerning A fib.  Pt with recent DCCV and has been in SR until after dinner this evening when he became midly SOB and noticed his heart rate was 125.  He states it is bouncing between 98-120 now.  He is still midly SOB but denies CP, he is not dyspnic throughout our conversation.  In reading through Dr. Jenel Lucks office note, he wanted the patient to try Cardizem as needed for breakthrough Afib.  The patient has not filled this Rx but says he is going to send his wife to the pharmacy now to obtain Rx.  I have informed him to take the medication q6 hours as needed, per Dr. Jenel Lucks note.  He is extremely frustrated with the situation and request that [facility] call him in the morning to discuss further options.  If the cardizem does not control his rates and he begins to have worsening symptoms I have advised him to present to the ER.  He voices understanding.  Again, patient was extremely frustrated with his PAF.   Initial call taken by: Robbi Garter NP-PA,  April 21, 2010 7:59 PM

## 2010-10-04 NOTE — Assessment & Plan Note (Signed)
Summary: rov dicuss ablation/sl   Visit Type:  Follow-up Referring Provider:  Valera Castle, MD Primary Provider:  Olivia Canter MD   History of Present Illness: The patient presents today for electrophysiology followup for recurrent atrial fibrillation.  He reports symptoms of fatigue and decreased exercise tolerance during afib.  The patient denies symptoms of chest pain, shortness of breath, orthopnea, PND, lower extremity edema, dizziness, presyncope, syncope, or neurologic sequela. The patient is tolerating medications without difficulties and is otherwise without complaint today.   Current Medications (verified): 1)  Simvastatin 20 Mg Tabs (Simvastatin) .Marland Kitchen.. 1 Tab Once Daily 2)  Lisinopril 5 Mg Tabs (Lisinopril) .Marland Kitchen.. 1 Tab Once Daily 3)  Aspirin 81 Mg Tbec (Aspirin) .... Take One Tablet By Mouth Daily 4)  Carvedilol 25 Mg Tabs (Carvedilol) .... One By Mouth Two Times A Day 5)  Fish Oil 1000 Mg Caps (Omega-3 Fatty Acids) .... 2 Cap Once Daily 6)  Fiber Supplement .... Use As Directed Once Daily 7)  Nitroglycerin 0.4 Mg Subl (Nitroglycerin) .... One Tablet Under Tongue Every 5 Minutes As Needed For Chest Pain---May Repeat Times Three 8)  Warfarin Sodium 4 Mg Tabs (Warfarin Sodium) .... Use As Directed By Anticoagulation Clinic 9)  Flomax 0.4 Mg Caps (Tamsulosin Hcl) .... Take 1 Tablet Daily 10)  Multivitamins   Tabs (Multiple Vitamin) .... Take One Tablet By Mouth Once Daily. 11)  Multaq 400 Mg Tabs (Dronedarone Hcl) .... Two Times A Day  Allergies (verified): No Known Drug Allergies  Past History:  Past Medical History: Reviewed history from 05/05/2009 and no changes required. Persistent Atrial Fibrillation s/p ablation CAD  s/p PCI x3 2006 Cerebrovascular Disease Diabetes Type 2 Hyperlipidemia Hypertension Prior morbid obesity, has lost 120 lbs with lifestyle modification OSA, improved with weight loss  Past Surgical History: Reviewed history from 05/05/2009 and no  changes required. eye surgery at 48 months of age descending testicle surgery at age 2 afib ablation 02/08/09  Family History: Reviewed history from 01/20/2009 and no changes required. Alzheimers dementia (mother)  Social History: Reviewed history from 01/20/2009 and no changes required. Lives in [place] with spouse.  Full Time mortgage collections. Married.  Wife manages [employer]. Tobacco Use - No.  Alcohol Use - no Regular Exercise - yes Drug Use - no  Review of Systems       All systems are reviewed and negative except as listed in the HPI.   Vital Signs:  Patient profile:   50 year old male Height:      72 inches Weight:      184 pounds BMI:     25.05 Pulse rate:   56 / minute BP sitting:   106 / 64  (left arm)  Vitals Entered By: Laurance Flatten CMA (July 11, 2010 8:45 AM)  Physical Exam  General:  Well developed, well nourished, in no acute distress. Head:  normocephalic and atraumatic Eyes:  PERRLA/EOM intact; conjunctiva and lids normal. Mouth:  Teeth, gums and palate normal. Oral mucosa normal. Neck:  Neck supple, no JVD. No masses, thyromegaly or abnormal cervical nodes. Lungs:  Clear bilaterally to auscultation and percussion. Heart:  Non-displaced PMI, chest non-tender; regular rate and rhythm, S1, S2 without murmurs, rubs or gallops. Carotid upstroke normal, no bruit. Normal abdominal aortic size, no bruits. Femorals normal pulses, no bruits. Pedals normal pulses. No edema, no varicosities. Abdomen:  Bowel sounds positive; abdomen soft and non-tender without masses, organomegaly, or hernias noted. No hepatosplenomegaly. Msk:  Back normal, normal gait.  Muscle strength and tone normal. Pulses:  pulses normal in all 4 extremities Extremities:  No clubbing or cyanosis. Neurologic:  Alert and oriented x 3.   EKG  Procedure date:  07/11/2010  Findings:      sinus bradycardia 56 bpm, otherwise normal ekg  Impression & Recommendations:  Problem # 1:   ATRIAL FIBRILLATION (ICD-427.31) Mr Gaymon has recurrent sympotomatic atrial fibrillation.  He  has previously failed medical therapy with coreg and amiodarone.  Therapeutic strategies for afib including medicine and repeat catheter ablation were discussed in detail with the patient today.  Risk, benefits, and alternatives to EP study and radiofrequency ablation for afib were also discussed in detail today. These risks include but are not limited to stroke, bleeding, vascular damage, tamponade, perforation, damage to the esophagus, lungs, and other structures, pulmonary vein stenosis, worsening renal function, and death. The patient understands these risk and wishes to proceed. We will therefore plan repeat catheter ablation at the next available time.  We will continue multaq in the interim.  Problem # 2:  COUMADIN THERAPY (ICD-V58.61) goal INR 2-3  Problem # 3:  CAD (ICD-414.00) no symptoms of ischemia  Other Orders: TLB-BMP (Basic Metabolic Panel-BMET) (80048-METABOL) TLB-CBC Platelet - w/Differential (85025-CBCD) TLB-PTT (85730-PTTL)

## 2010-10-04 NOTE — Medication Information (Signed)
Summary: rov/sp  Anticoagulant Therapy  Managed by: Cloyde Reams, RN, BSN Referring MD: Valera Castle MD PCP: Olivia Canter MD Supervising MD: Antoine Poche MD, Fayrene Fearing Indication 1: Atrial Fibrillation (ICD-427.31) Indication 2: Mitral Valve Regurgitation (ICD-Regurg) Lab Used: [facility] Site: [facility] INR POC 2.2 INR RANGE 2 - 3      Any changes in medication regimen? yes       Details: Starting on Cardizem, incr Coreg 25mg  bid      Allergies: No Known Drug Allergies  Anticoagulation Management History:      The patient is taking warfarin and comes in today for a routine follow up visit.  Negative risk factors for bleeding include an age less than 42 years old.  The bleeding index is 'low risk'.  Positive CHADS2 values include History of HTN.  Negative CHADS2 values include Age > 9 years old.  The start date was 10/16/2006.  His last INR was 2.4 ratio.  Anticoagulation responsible provider: Antoine Poche MD, Fayrene Fearing.  INR POC: 2.2.  Cuvette Lot#: [id number].  Exp: 07/2011.    Anticoagulation Management Assessment/Plan:      The patient's current anticoagulation dose is Warfarin sodium 4 mg tabs: Use as directed by Anticoagulation Clinic.  The target INR is 2 - 3.  The next INR is due 05/03/2010.  Anticoagulation instructions were given to patient.  Results were reviewed/authorized by Cloyde Reams, RN, BSN.  He was notified by Cloyde Reams RN.         Prior Anticoagulation Instructions: INR 1.9 Today 10mg s then change dose to 6mg s everyday except 8mg s on Mondays, Wednesdays and Fridays. Recheck in 2 weeks.   Current Anticoagulation Instructions: INR 2.2  Take 2.5 tablets today, then resume 1.5 tablets daily except 2 tablets on Mondays, Wednesdays, and Fridays.  Recheck in 1 week. Pending Ablation on 05/26/10.

## 2010-10-04 NOTE — Letter (Signed)
Summary: ELectrophysiology/Ablation Procedure Instructions  [facility], Main Office  [address]   [place], [place] [address]   Phone: [phone]  Fax: [phone]     Electrophysiology/Ablation Procedure Instructions    You are scheduled for a(n) afib ablation on 07/21/10 at 7:30am with Dr. [name]  1.  Please come to the Short Stay Center at [facility] at 5:30am on the day of your procedure.  2.  Come prepared to stay overnight.   Please bring your insurance cards and a list of your medications.  3.  Come to the [facility] office on 07/19/10 for lab work.  .  You do not have to be fasting.  4.  Do not have anything to eat or drink after midnight the night before your procedure.  5.  All of your  medications may be taken with a small amount of water.  6.  Educational material received:   Ablation   * Occasionally, EP studies and ablations can become lengthy.  Please make your family aware of this before your procedure starts.  Average time ranges from 2-8 hours for EP studies/ablations.  Your physician will locate your family after the procedure with the results.  * If you have any questions after you get home, please call the office at [phone].  Anselm Pancoast

## 2010-10-04 NOTE — Medication Information (Signed)
Summary: rov/sl  Anticoagulant Therapy  Managed by: Weston Brass, PharmD Referring MD: Valera Castle MD PCP: Olivia Canter MD Supervising MD: Shirlee Latch MD, Dalton Indication 1: Atrial Fibrillation (ICD-427.31) Indication 2: Mitral Valve Regurgitation (ICD-Regurg) Lab Used: [facility] Site: [facility] INR POC 2.2 INR RANGE 2 - 3  Dietary changes: no    Health status changes: no    Bleeding/hemorrhagic complications: no    Recent/future hospitalizations: no    Any changes in medication regimen? no    Recent/future dental: no  Any missed doses?: no       Is patient compliant with meds? yes       Allergies: No Known Drug Allergies  Anticoagulation Management History:      The patient is taking warfarin and comes in today for a routine follow up visit.  Negative risk factors for bleeding include an age less than 62 years old.  The bleeding index is 'low risk'.  Positive CHADS2 values include History of HTN.  Negative CHADS2 values include Age > 41 years old.  The start date was 10/16/2006.  His last INR was 2.4 ratio.  Anticoagulation responsible Riya Huxford: Shirlee Latch MD, Dalton.  INR POC: 2.2.  Exp: 07/2011.    Anticoagulation Management Assessment/Plan:      The patient's current anticoagulation dose is Warfarin sodium 4 mg tabs: Use as directed by Anticoagulation Clinic.  The target INR is 2 - 3.  The next INR is due 07/11/2010.  Anticoagulation instructions were given to patient.  Results were reviewed/authorized by Weston Brass, PharmD.  He was notified by Weston Brass PharmD.         Prior Anticoagulation Instructions: INR 2.7  Continue taking Coumadin 8 mg ( 2 tabs) on M, W, F and Coumadin 6 mg (1.5 tabs) on Su, Tu, Th, Sa. Return to clinic in 1 weeks.    Current Anticoagulation Instructions: INR 2.2  Continue same dose of 1 1/2 tablets every day except 2 tablets on Monday, Wednesday and Friday.  Recheck INR in 1 week.

## 2010-10-04 NOTE — Medication Information (Signed)
Summary: ROV/LB  Anticoagulant Therapy  Managed by: Bethena Midget, RN Referring MD: Valera Castle MD PCP: Olivia Canter MD Supervising MD: Eden Emms MD, Theron Arista Indication 1: Atrial Fibrillation (ICD-427.31) Indication 2: Mitral Valve Regurgitation (ICD-Regurg) Lab Used: [facility] Site: [facility] INR POC 1.7 INR RANGE 2 - 3  Dietary changes: no    Health status changes: no    Bleeding/hemorrhagic complications: no    Recent/future hospitalizations: no    Any changes in medication regimen? no    Recent/future dental: no  Any missed doses?: no       Is patient compliant with meds? yes       Allergies: No Known Drug Allergies  Anticoagulation Management History:      The patient is taking warfarin and comes in today for a routine follow up visit.  Negative risk factors for bleeding include an age less than 40 years old.  The bleeding index is 'low risk'.  Positive CHADS2 values include History of HTN.  Negative CHADS2 values include Age > 58 years old.  The start date was 10/16/2006.  His last INR was 2.4 ratio.  Anticoagulation responsible provider: Eden Emms MD, Theron Arista.  INR POC: 1.7.  Cuvette Lot#: [id number].  Exp: 12/2010.    Anticoagulation Management Assessment/Plan:      The patient's current anticoagulation dose is Warfarin sodium 4 mg tabs: Use as directed by Anticoagulation Clinic.  The target INR is 2 - 3.  The next INR is due 10/06/2009.  Anticoagulation instructions were given to patient.  Results were reviewed/authorized by Bethena Midget, RN.  He was notified by Lew Dawes, PharmD Candidate.         Prior Anticoagulation Instructions: INR 2.1 CONTINUE TO TAKE 1.5 TABLETS EVERYDAY EXCEPT TAKE 1 TABLET ON SUNDAYS AND THURSDAYS.  RECHECK IN 3 WEEKS.  Current Anticoagulation Instructions: INR 1.7  Take an extra 0.5 tablet today then increase dose to 1.5 tablets daily except 1 tablet on Sundays. Recheck in 2 weeks.

## 2010-10-04 NOTE — Letter (Signed)
Summary: Generic Letter  [facility], Main Office  [address]   [place], [place] [address]   Phone: [phone]  Fax: [phone]        May 31, 2010 MRN: [id number]    Gennie Blackstock [address] [place], [place]  [address]     To Whom It May Concern,     Mr Cotto's ablation procedure was rescheduled due to a supratherapeutic INR of 3.4 and will be resscheduled to 07/15/10 at 7:30am.  For further questions please feel free to call our office at [phone].   Sincerely,    Dr. Hillis Range, MD/Kelly Darl Pikes, RN, BSN  This letter has been electronically signed by your physician.

## 2010-10-04 NOTE — Progress Notes (Signed)
Summary: Pt request call  Phone Note Call from Patient Call back at [phone]   Caller: Patient Summary of Call: pt request call Initial call taken by: Judie Grieve,  April 18, 2010 2:33 PM  Follow-up for Phone Call        04/20/10 at 9:15 pt aware Dennis Bast, RN, BSN  April 18, 2010 3:44 PM

## 2010-10-04 NOTE — Progress Notes (Signed)
Summary: pt has questions re bloodwork and ablation/would like call tomor  Phone Note Call from Patient   Caller: Patient [phone] Reason for Call: Talk to Nurse, Talk to Doctor Summary of Call: pt calling re blood work he had done/having abalation done-would like a call sometime tomorrow Initial call taken by: Glynda Jaeger,  July 12, 2010 4:53 PM  Follow-up for Phone Call        spoke with pt he wanted to talk to Dr Daleen Squibb regarding his blood work.  I told him he should follow up with his primary care MD just as Dr [name] has told him. He did see his primary back in 05/2010 for same problem.  I explained to him Dr Daleen Squibb would tell him the same.  I have faxed over his labs to [facility]rs [facility] office.  Pt aware. Dennis Bast, RN, BSN  July 12, 2010 5:08 PM

## 2010-10-04 NOTE — Progress Notes (Signed)
Summary: Wants EKG today not tomorrow  Phone Note Call from Patient Call back at Home Phone [phone] Call back at [phone]   Caller: Mom Summary of Call: Pt want to come today for that EKG and not tomorrow. Call pt on cell phone [phone] Initial call taken by: Judie Grieve,  October 19, 2009 1:28 PM  Follow-up for Phone Call        [facility] for pt to call me back.  I'm in clinic this afternoon and can't do EKG.  This morning I could have done it because I was in triage Dennis Bast, RN, BSN  October 19, 2009 2:16 PM

## 2010-10-04 NOTE — Progress Notes (Signed)
Summary: questions re lab results-MD follow-up requested  Phone Note Call from Patient   Caller: Patient Reason for Call: Talk to Nurse Summary of Call: pt has question re results of labs-pls call [phone] Initial call taken by: Glynda Jaeger,  May 17, 2010 1:24 PM  Follow-up for Phone Call        spoke with pt re: EF change from 55% to 45% from 12/10 to 9/11 and his concerns. Pt would like/needs to discuss concerns with MD. Claris Gladden, RN BSN Discussed with pt at his INR visit.  He will discuss more with Dr  [name] at ablation Dennis Bast, RN, BSN  May 18, 2010 10:06 AM

## 2010-10-04 NOTE — Assessment & Plan Note (Signed)
Summary: per check out/saf   Visit Type:  Follow-up Referring Provider:  Valera Castle, MD Primary Provider:  Olivia Canter MD   History of Present Illness: The patient presents today for routine electrophysiology followup. He reports doing very well since last being seen in our clinic.   He has had no further afib since his cardioversion 12/10.  His cellulitis has resolved.  The patient denies symptoms of palpitations, chest pain, shortness of breath, orthopnea, PND, lower extremity edema, dizziness, presyncope, syncope, or neurologic sequela. The patient is tolerating medications without difficulties and is otherwise without complaint today.   Current Medications (verified): 1)  Simvastatin 20 Mg Tabs (Simvastatin) .Marland Kitchen.. 1 Tab Once Daily 2)  Lisinopril 5 Mg Tabs (Lisinopril) .Marland Kitchen.. 1 Tab Once Daily 3)  Aspirin 81 Mg Tbec (Aspirin) .... Take One Tablet By Mouth Daily 4)  Carvedilol 12.5 Mg Tabs (Carvedilol) .... Take One Tablet By Mouth Twice A Day 5)  Fish Oil 1000 Mg Caps (Omega-3 Fatty Acids) .... 2 Cap Once Daily 6)  Fiber Supplement .... Use As Directed Once Daily 7)  Nitroglycerin 0.4 Mg Subl (Nitroglycerin) .... One Tablet Under Tongue Every 5 Minutes As Needed For Chest Pain---May Repeat Times Three 8)  Warfarin Sodium 4 Mg Tabs (Warfarin Sodium) .... Use As Directed By Anticoagulation Clinic 9)  Flomax 0.4 Mg Caps (Tamsulosin Hcl) .... Take 1 Tablet Daily 10)  Diltiazem Hcl Er Beads 120 Mg Xr24h-Cap (Diltiazem Hcl Er Beads) .... Take One Capsule By Mouth Daily 11)  Multivitamins   Tabs (Multiple Vitamin) .... Take One Tablet By Mouth Once Daily.  Allergies (verified): No Known Drug Allergies  Past History:  Past Medical History: Reviewed history from 05/05/2009 and no changes required. Persistent Atrial Fibrillation s/p ablation CAD  s/p PCI x3 2006 Cerebrovascular Disease Diabetes Type 2 Hyperlipidemia Hypertension Prior morbid obesity, has lost 120 lbs with lifestyle  modification OSA, improved with weight loss  Past Surgical History: Reviewed history from 05/05/2009 and no changes required. eye surgery at 31 months of age descending testicle surgery at age 48 afib ablation 02/08/09  Social History: Reviewed history from 01/20/2009 and no changes required. Lives in [place] with spouse.  Full Time mortgage collections. Married.  Wife manages [employer]. Tobacco Use - No.  Alcohol Use - no Regular Exercise - yes Drug Use - no  Review of Systems       All systems are reviewed and negative except as listed in the HPI.   Vital Signs:  Patient profile:   50 year old male Height:      72 inches Weight:      185 pounds BMI:     25.18 Pulse rate:   67 / minute BP sitting:   110 / 70  (left arm)  Vitals Entered By: Laurance Flatten CMA (September 22, 2009 11:59 AM)  Physical Exam  General:  Well developed, well nourished, in no acute distress. Head:  normocephalic and atraumatic Eyes:  PERRLA/EOM intact; conjunctiva and lids normal. Nose:  no deformity, discharge, inflammation, or lesions Mouth:  Teeth, gums and palate normal. Oral mucosa normal. Neck:  Neck supple, no JVD. No masses, thyromegaly or abnormal cervical nodes. Lungs:  Clear bilaterally to auscultation and percussion. Heart:  Non-displaced PMI, chest non-tender; regular rate and rhythm, S1, S2 without murmurs, rubs or gallops. Carotid upstroke normal, no bruit. Normal abdominal aortic size, no bruits. Femorals normal pulses, no bruits. Pedals normal pulses. No edema, no varicosities. Abdomen:  Bowel sounds positive; abdomen soft  and non-tender without masses, organomegaly, or hernias noted. No hepatosplenomegaly. Msk:  Back normal, normal gait. Muscle strength and tone normal. Pulses:  pulses normal in all 4 extremities Extremities:  No clubbing or cyanosis. Neurologic:  Alert and oriented x 3. Skin:  Intact without lesions or rashes. Psych:  Normal affect.   EKG  Procedure date:   09/22/2009  Findings:      sinus 67 bpm, otherwise normal ekg  Impression & Recommendations:  Problem # 1:  ATRIAL FIBRILLATION (ICD-427.31) Doing well. He has had only 1 episode of afib since his ablation, which was precipitated by a cellulitis and antibiotics. No further afib since that time. Today, I offered pradaxa as an alternative to coumadin.  He is clear in his decision to continue coumadin at this time. We will stop cardizem.  NO other changes today.  His updated medication list for this problem includes:    Aspirin 81 Mg Tbec (Aspirin) .Marland Kitchen... Take one tablet by mouth daily    Carvedilol 12.5 Mg Tabs (Carvedilol) .Marland Kitchen... Take one tablet by mouth twice a day    Warfarin Sodium 4 Mg Tabs (Warfarin sodium) ..... Use as directed by anticoagulation clinic  Orders: EKG w/ Interpretation (93000)  Problem # 2:  ESSENTIAL HYPERTENSION, BENIGN (ICD-401.1) stable will follow BP off cardizem  The following medications were removed from the medication list:    Diltiazem Hcl Er Beads 120 Mg Xr24h-cap (Diltiazem hcl er beads) .Marland Kitchen... Take one capsule by mouth daily His updated medication list for this problem includes:    Lisinopril 5 Mg Tabs (Lisinopril) .Marland Kitchen... 1 tab once daily    Aspirin 81 Mg Tbec (Aspirin) .Marland Kitchen... Take one tablet by mouth daily    Carvedilol 12.5 Mg Tabs (Carvedilol) .Marland Kitchen... Take one tablet by mouth twice a day  Problem # 3:  CAD (ICD-414.00) no symptoms of angina  The following medications were removed from the medication list:    Diltiazem Hcl Er Beads 120 Mg Xr24h-cap (Diltiazem hcl er beads) .Marland Kitchen... Take one capsule by mouth daily His updated medication list for this problem includes:    Lisinopril 5 Mg Tabs (Lisinopril) .Marland Kitchen... 1 tab once daily    Aspirin 81 Mg Tbec (Aspirin) .Marland Kitchen... Take one tablet by mouth daily    Carvedilol 12.5 Mg Tabs (Carvedilol) .Marland Kitchen... Take one tablet by mouth twice a day    Nitroglycerin 0.4 Mg Subl (Nitroglycerin) ..... One tablet under tongue  every 5 minutes as needed for chest pain---may repeat times three    Warfarin Sodium 4 Mg Tabs (Warfarin sodium) ..... Use as directed by anticoagulation clinic  Orders: EKG w/ Interpretation (93000)  Patient Instructions: 1)  Your physician recommends that you schedule a follow-up appointment in: 3 months with Dr [name] 2)  Your physician has recommended you make the following change in your medication: stop Diltizem

## 2010-10-04 NOTE — Letter (Signed)
Summary: ELectrophysiology/Ablation Procedure Instructions  [facility], Main Office  [address]   [place], [place] [address]   Phone: [phone]  Fax: [phone]     Electrophysiology/Ablation Procedure Instructions    You are scheduled for a(n) afib ablation on  07/15/10 at 7:30am with Dr. [name].  1.  Please come to the Short Stay Center at [facility] at 5:30am on the day of your procedure.  2.  Come prepared to stay overnight.   Please bring your insurance cards and a list of your medications.  3.  Come to the [place] office on 07/11/10 for lab work.    You do not have to be fasting.  4.  Do not have anything to eat or drink after midnight the night before your procedure.  5.  .  All of your  medications may be taken with a small amount of water.    * Occasionally, EP studies and ablations can become lengthy.  Please make your family aware of this before your procedure starts.  Average time ranges from 2-8 hours for EP studies/ablations.  Your physician will locate your family after the procedure with the results.  * If you have any questions after you get home, please call the office at [phone]. [name]  TEE- on 07/14/10 with Dr Jens Som at 11:00am Check in at [facility] at 10:00am  Nothing to eat or drink after midnight the night prior to procedure

## 2010-10-04 NOTE — Medication Information (Signed)
Summary: rov/cb  Anticoagulant Therapy  Managed by: Bethena Midget, RN, BSN Referring MD: Valera Castle MD PCP: Olivia Canter MD Supervising MD: Myrtis Ser MD, Tinnie Gens Indication 1: Atrial Fibrillation (ICD-427.31) Indication 2: Mitral Valve Regurgitation (ICD-Regurg) Lab Used: [facility] Site: [facility] INR POC 2.5 INR RANGE 2 - 3  Dietary changes: no    Health status changes: no    Bleeding/hemorrhagic complications: no    Recent/future hospitalizations: no    Any changes in medication regimen? no    Recent/future dental: no  Any missed doses?: no       Is patient compliant with meds? yes      Comments: Seeing Dr Daleen Squibb today  Allergies: No Known Drug Allergies  Anticoagulation Management History:      The patient is taking warfarin and comes in today for a routine follow up visit.  Negative risk factors for bleeding include an age less than 30 years old.  The bleeding index is 'low risk'.  Positive CHADS2 values include History of HTN.  Negative CHADS2 values include Age > 2 years old.  The start date was 10/16/2006.  His last INR was 2.4 ratio.  Anticoagulation responsible provider: Myrtis Ser MD, Tinnie Gens.  INR POC: 2.5.  Cuvette Lot#: [id number].  Exp: 05/2011.    Anticoagulation Management Assessment/Plan:      The patient's current anticoagulation dose is Warfarin sodium 4 mg tabs: Use as directed by Anticoagulation Clinic.  The target INR is 2 - 3.  The next INR is due 04/12/2010.  Anticoagulation instructions were given to patient.  Results were reviewed/authorized by Bethena Midget, RN, BSN.  He was notified by Bethena Midget, RN, BSN.         Prior Anticoagulation Instructions: INR 2.1. Take 1.5 tablets daily except 2 tablets on Wed and Sat. Recheck in 4 weeks.  Current Anticoagulation Instructions: INR 2.5 Continue 6mg s daily except 8mg s on Wednesdays and Saturdays. Recheck in 4 weeks.

## 2010-10-04 NOTE — Progress Notes (Signed)
Summary: pt inr results high  Phone Note Call from Patient Call back at [phone]   Caller: Spouse/Linda Reason for Call: Talk to Nurse, Talk to Doctor Summary of Call: pt.inr is 3.09 and they need to know if they need to do something about it prior to procedure for tommorow Initial call taken by: Omer Jack,  July 14, 2010 12:12 PM  Follow-up for Phone Call        Discussed with Dr. [name].  He is okay if pt holds Coumadin today and continues with plans for ablation tomorrow.  LMOM for pt with this information.   Weston Brass PharmD  July 14, 2010 2:02 PM

## 2010-10-04 NOTE — Progress Notes (Signed)
Summary: c/o afib last night  Phone Note Call from Patient Call back at Home Phone [phone]   Caller: Patient [phone] Reason for Call: Talk to Nurse Details for Reason: c/o afib last night. cardioversion last friday.  Initial call taken by: Lorne Skeens,  April 22, 2010 8:03 AM  Follow-up for Phone Call        [facility] for pt to call me back Dennis Bast, RN, BSN  April 22, 2010 9:00 AM [phone] ext [phone] call him back at work after talking to Dr [name] as what to do Dennis Bast, RN, BSN  April 22, 2010 9:09 AM  Pt calling back for follow up call back [phone] Judie Grieve  April 22, 2010 3:04 PM   Additional Follow-up for Phone Call Additional follow up Details #1::        pt called and is still out of rhythm.  Will add on to Dr Jenel Lucks schedule today for follow up Dennis Bast, RN, BSN  April 25, 2010 9:24 AM

## 2010-10-04 NOTE — Medication Information (Signed)
Summary: rov/tm  Anticoagulant Therapy  Managed by: Elaina Pattee, PharmD Referring MD: Valera Castle MD PCP: Olivia Canter MD Supervising MD: Myrtis Ser MD, Tinnie Gens Indication 1: Atrial Fibrillation (ICD-427.31) Indication 2: Mitral Valve Regurgitation (ICD-Regurg) Lab Used: [facility] Site: [facility] INR POC 2.3 INR RANGE 2 - 3  Dietary changes: no    Health status changes: no    Bleeding/hemorrhagic complications: no    Recent/future hospitalizations: no    Any changes in medication regimen? no    Recent/future dental: no  Any missed doses?: no       Is patient compliant with meds? yes       Current Medications (verified): 1)  Simvastatin 20 Mg Tabs (Simvastatin) .Marland Kitchen.. 1 Tab Once Daily 2)  Lisinopril 5 Mg Tabs (Lisinopril) .Marland Kitchen.. 1 Tab Once Daily 3)  Aspirin 81 Mg Tbec (Aspirin) .... Take One Tablet By Mouth Daily 4)  Carvedilol 12.5 Mg Tabs (Carvedilol) .... Take One Tablet By Mouth Twice A Day 5)  Fish Oil 1000 Mg Caps (Omega-3 Fatty Acids) .... 2 Cap Once Daily 6)  Fiber Supplement .... Use As Directed Once Daily 7)  Nitroglycerin 0.4 Mg Subl (Nitroglycerin) .... One Tablet Under Tongue Every 5 Minutes As Needed For Chest Pain---May Repeat Times Three 8)  Warfarin Sodium 4 Mg Tabs (Warfarin Sodium) .... Use As Directed By Anticoagulation Clinic 9)  Flomax 0.4 Mg Caps (Tamsulosin Hcl) .... Take 1 Tablet Daily 10)  Multivitamins   Tabs (Multiple Vitamin) .... Take One Tablet By Mouth Once Daily.  Allergies: No Known Drug Allergies  Anticoagulation Management History:      The patient is taking warfarin and comes in today for a routine follow up visit.  Negative risk factors for bleeding include an age less than 5 years old.  The bleeding index is 'low risk'.  Positive CHADS2 values include History of HTN.  Negative CHADS2 values include Age > 58 years old.  The start date was 10/16/2006.  His last INR was 2.4 ratio.  Anticoagulation responsible provider: Myrtis Ser MD, Tinnie Gens.   INR POC: 2.3.  Cuvette Lot#: [id number].  Exp: 04/2011.    Anticoagulation Management Assessment/Plan:      The patient's current anticoagulation dose is Warfarin sodium 4 mg tabs: Use as directed by Anticoagulation Clinic.  The target INR is 2 - 3.  The next INR is due 03/02/2010.  Anticoagulation instructions were given to patient.  Results were reviewed/authorized by Elaina Pattee, PharmD.  He was notified by Elaina Pattee, PharmD.         Prior Anticoagulation Instructions: INR 1.6 Today take 8mg s then change dose to 6mg s daily except 8mg s on Wednesdays and Saturdays. Recheck in 2 weeks.   Current Anticoagulation Instructions: INR 2.3. Take 1.5 tablets daily except 2 tablets on Wed and Sat. Recheck in 2-3 weeks.

## 2010-10-04 NOTE — Medication Information (Signed)
Summary: rov/sp  Anticoagulant Therapy  Managed by: Weston Brass, PharmD Referring MD: Valera Castle MD PCP: Olivia Canter MD Supervising MD: Shirlee Latch MD, Javen Ridings Indication 1: Atrial Fibrillation (ICD-427.31) Indication 2: Mitral Valve Regurgitation (ICD-Regurg) Lab Used: [facility] Site: [facility] INR POC 1.4 INR RANGE 2 - 3  Dietary changes: no    Health status changes: no    Bleeding/hemorrhagic complications: no    Recent/future hospitalizations: yes       Details: pending ablation. Canceled on Friday due to high INR  Any changes in medication regimen? no    Recent/future dental: no  Any missed doses?: yes     Details: has been holding Coumadin since Friday  Is patient compliant with meds? yes       Allergies: No Known Drug Allergies  Anticoagulation Management History:      The patient is taking warfarin and comes in today for a routine follow up visit.  Negative risk factors for bleeding include an age less than 35 years old.  The bleeding index is 'low risk'.  Positive CHADS2 values include History of HTN.  Negative CHADS2 values include Age > 34 years old.  The start date was 10/16/2006.  His last INR was 3.0 ratio.  Anticoagulation responsible provider: Shirlee Latch MD, Malayjah Otoole.  INR POC: 1.4.  Exp: 07/2011.    Anticoagulation Management Assessment/Plan:      The patient's current anticoagulation dose is Warfarin sodium 4 mg tabs: Use as directed by Anticoagulation Clinic.  The target INR is 2 - 3.  The next INR is due 07/26/2010.  Anticoagulation instructions were given to patient.  Results were reviewed/authorized by Weston Brass, PharmD.  He was notified by Weston Brass PharmD.         Prior Anticoagulation Instructions: INR 2.8  Continue same dose of 1 1/2 tablets every day except 2 tablets on Monday, Wednesday and Friday.  Recheck INR in 2 weeks.   Current Anticoagulation Instructions: INR 1.4  Start Pradaxa 150mg  twice daily.

## 2010-10-04 NOTE — Progress Notes (Signed)
  Pt left FMLA & Short Term Disability papers, I sent to [employer] for completion. [facility] [place]  May 03, 2010 8:43 AM

## 2010-10-04 NOTE — Progress Notes (Signed)
Summary: pt needs time and location of procedure  Phone Note Call from Patient Call back at [phone]   Caller: Patient Reason for Call: Talk to Nurse, Talk to Doctor Summary of Call: pt needs to know location he needs to be at and time of TEE and abaltion Initial call taken by: Omer Jack,  May 12, 2010 12:24 PM  Follow-up for Phone Call        CT on 05/16/10 TEE-05/25/10 ablation-05/26/10

## 2010-10-04 NOTE — Medication Information (Signed)
Summary: [name]  Anticoagulant Therapy  Managed by: Eda Keys, PharmD Referring MD: Valera Castle MD PCP: Olivia Canter MD Supervising MD: Shirlee Latch MD, Sascha Baugher Indication 1: Atrial Fibrillation (ICD-427.31) Indication 2: Mitral Valve Regurgitation (ICD-Regurg) Lab Used: [facility] Site: [facility] INR POC 2.2 INR RANGE 2 - 3  Dietary changes: no    Health status changes: no    Bleeding/hemorrhagic complications: no    Recent/future hospitalizations: no    Any changes in medication regimen? no    Recent/future dental: no  Any missed doses?: no       Is patient compliant with meds? yes       Allergies: No Known Drug Allergies  Anticoagulation Management History:      The patient is taking warfarin and comes in today for a routine follow up visit.  Negative risk factors for bleeding include an age less than 66 years old.  The bleeding index is 'low risk'.  Positive CHADS2 values include History of HTN.  Negative CHADS2 values include Age > 32 years old.  The start date was 10/16/2006.  His last INR was 2.4 ratio.  Anticoagulation responsible provider: Shirlee Latch MD, Kemara Quigley.  INR POC: 2.2.  Cuvette Lot#: [id number].  Exp: 12/2010.    Anticoagulation Management Assessment/Plan:      The patient's current anticoagulation dose is Warfarin sodium 4 mg tabs: Use as directed by Anticoagulation Clinic.  The target INR is 2 - 3.  The next INR is due 11/17/2009.  Anticoagulation instructions were given to patient.  Results were reviewed/authorized by Eda Keys, PharmD.  He was notified by Eda Keys.         Prior Anticoagulation Instructions: INR 1.7  Take 2 tablets today then start taking 1.5 tablets daily.  Recheck in 2 weeks.    Current Anticoagulation Instructions: INR 2.2  Continue current dosing schedule.  Take 1.5 tablets daily. Return to clinic in 4 weeks.

## 2010-10-04 NOTE — Medication Information (Signed)
Summary: rov/sp  Anticoagulant Therapy  Managed by: Weston Brass, PharmD Referring MD: Valera Castle MD PCP: Olivia Canter MD Supervising MD: Myrtis Ser MD, Tinnie Gens Indication 1: Atrial Fibrillation (ICD-427.31) Indication 2: Mitral Valve Regurgitation (ICD-Regurg) Lab Used: [facility] Site: [facility] INR POC 3.0 INR RANGE 2 - 3  Dietary changes: no    Health status changes: no    Bleeding/hemorrhagic complications: no    Recent/future hospitalizations: no    Any changes in medication regimen? no    Recent/future dental: no  Any missed doses?: no       Is patient compliant with meds? yes      Comments: pending ablation   Allergies: No Known Drug Allergies  Anticoagulation Management History:      The patient is taking warfarin and comes in today for a routine follow up visit.  Negative risk factors for bleeding include an age less than 3 years old.  The bleeding index is 'low risk'.  Positive CHADS2 values include History of HTN.  Negative CHADS2 values include Age > 88 years old.  The start date was 10/16/2006.  His last INR was 2.4 ratio.  Anticoagulation responsible provider: Myrtis Ser MD, Tinnie Gens.  INR POC: 3.0.  Exp: 07/2011.    Anticoagulation Management Assessment/Plan:      The patient's current anticoagulation dose is Warfarin sodium 4 mg tabs: Use as directed by Anticoagulation Clinic.  The target INR is 2 - 3.  The next INR is due 06/29/2010.  Anticoagulation instructions were given to patient.  Results were reviewed/authorized by Weston Brass, PharmD.  He was notified by Weston Brass PharmD.         Prior Anticoagulation Instructions: INR 2.2   Take 2 tablets on Monday, Wednesday, and Friday, then 1 1/2 tablets on Sunday, Tuesday, Thursday, and Saturday. Recheck in 1 week.   Current Anticoagulation Instructions: INR 3.0  Take 1 1/2 tablets tomorrow and Friday then resume same dose of 1 1/2 tablets every day except 2 tablets on Monday, Wednesday and Friday.  Recheck INR  in 1 week.

## 2010-10-04 NOTE — Miscellaneous (Signed)
Summary: pradaxa 150mg  bid  Clinical Lists Changes  Medications: Changed medication from WARFARIN SODIUM 4 MG TABS (WARFARIN SODIUM) Use as directed by Anticoagulation Clinic to PRADAXA 150 MG CAPS (DABIGATRAN ETEXILATE MESYLATE) one by mouth bid - Signed Rx of PRADAXA 150 MG CAPS (DABIGATRAN ETEXILATE MESYLATE) one by mouth bid;  #60 x 6;  Signed;  Entered by: Dennis Bast, RN, BSN;  Authorized by: Hillis Range, MD;  Method used: Electronically to [facility] [address], [place], [place]  [address], Ph: [phone], Fax: [phone]    Prescriptions: PRADAXA 150 MG CAPS (DABIGATRAN ETEXILATE MESYLATE) one by mouth bid  #60 x 6   Entered by:   Dennis Bast, RN, BSN   Authorized by:   Hillis Range, MD   Signed by:   Dennis Bast, RN, BSN on 07/20/2010   Method used:   Electronically to        [facility] [phone]* (retail)       [address] [place], [place]  [address]       Ph: [phone]       Fax: [phone]   RxID:   [id number]

## 2010-10-04 NOTE — Assessment & Plan Note (Signed)
Summary: rov   Visit Type:  Follow-up Referring Provider:  Valera Castle, MD Primary Provider:  Olivia Canter MD   History of Present Illness: The patient presents today for electrophysiology followup. Unfortunately, he has returned to atrial fibrillation.  He is unaware of any triggers for this episode.  He reports symptoms of fatigue and decreased exercise tolerance..  The patient denies symptoms of chest pain, shortness of breath, orthopnea, PND, lower extremity edema, dizziness, presyncope, syncope, or neurologic sequela. The patient is tolerating medications without difficulties and is otherwise without complaint today.   Current Medications (verified): 1)  Simvastatin 20 Mg Tabs (Simvastatin) .Marland Kitchen.. 1 Tab Once Daily 2)  Lisinopril 5 Mg Tabs (Lisinopril) .Marland Kitchen.. 1 Tab Once Daily 3)  Aspirin 81 Mg Tbec (Aspirin) .... Take One Tablet By Mouth Daily 4)  Carvedilol 12.5 Mg Tabs (Carvedilol) .... Take One Tablet By Mouth Twice A Day 5)  Fish Oil 1000 Mg Caps (Omega-3 Fatty Acids) .... 2 Cap Once Daily 6)  Fiber Supplement .... Use As Directed Once Daily 7)  Nitroglycerin 0.4 Mg Subl (Nitroglycerin) .... One Tablet Under Tongue Every 5 Minutes As Needed For Chest Pain---May Repeat Times Three 8)  Warfarin Sodium 4 Mg Tabs (Warfarin Sodium) .... Use As Directed By Anticoagulation Clinic 9)  Flomax 0.4 Mg Caps (Tamsulosin Hcl) .... Take 1 Tablet Daily 10)  Multivitamins   Tabs (Multiple Vitamin) .... Take One Tablet By Mouth Once Daily. 11)  Cardizem 30 Mg Tabs (Diltiazem Hcl) .... One Tablet By Mouth Every 6 Hours For Break Through Afib  Allergies (verified): No Known Drug Allergies  Past History:  Past Medical History: Reviewed history from 05/05/2009 and no changes required. Persistent Atrial Fibrillation s/p ablation CAD  s/p PCI x3 2006 Cerebrovascular Disease Diabetes Type 2 Hyperlipidemia Hypertension Prior morbid obesity, has lost 120 lbs with lifestyle modification OSA, improved  with weight loss  Past Surgical History: Reviewed history from 05/05/2009 and no changes required. eye surgery at 4 months of age descending testicle surgery at age 78 afib ablation 02/08/09  Family History: Reviewed history from 01/20/2009 and no changes required. Alzheimers dementia (mother)  Social History: Reviewed history from 01/20/2009 and no changes required. Lives in [place] with spouse.  Full Time mortgage collections. Married.  Wife manages [employer]. Tobacco Use - No.  Alcohol Use - no Regular Exercise - yes Drug Use - no  Review of Systems       All systems are reviewed and negative except as listed in the HPI.   Vital Signs:  Patient profile:   50 year old male Height:      72 inches Weight:      187 pounds Pulse rate:   66 / minute BP sitting:   100 / 60  (left arm)  Vitals Entered By: Laurance Flatten CMA (April 25, 2010 10:12 AM)  Physical Exam  General:  Well developed, well nourished, in no acute distress. Head:  normocephalic and atraumatic Eyes:  PERRLA/EOM intact; conjunctiva and lids normal. Mouth:  Teeth, gums and palate normal. Oral mucosa normal. Neck:  Neck supple, no JVD. No masses, thyromegaly or abnormal cervical nodes. Lungs:  Clear bilaterally to auscultation and percussion. Heart:  iRRR, no m/r/g Abdomen:  Bowel sounds positive; abdomen soft and non-tender without masses, organomegaly, or hernias noted. No hepatosplenomegaly. Msk:  Back normal, normal gait. Muscle strength and tone normal. Pulses:  pulses normal in all 4 extremities Extremities:  No clubbing or cyanosis. Neurologic:  Alert and oriented x  3. Skin:  Intact without lesions or rashes. Cervical Nodes:  no significant adenopathy   EKG  Procedure date:  04/25/2010  Findings:      afib,  V rate 66 bpm, otherwise normal ekg  Impression & Recommendations:  Problem # 1:  ATRIAL FIBRILLATION (ICD-427.31) Mr Samaan has recurrent sympotomatic atrial fibrillation.  He   has previously failed medical therapy with coreg and amiodarone.  Therapeutic strategies for afib including medicine and repeat catheter ablation were discussed in detail with the patient today.  I have offered tikosyn as AAD drug of choice.  Risk, benefits, and alternatives to EP study and radiofrequency ablation for afib were also discussed in detail today. These risks include but are not limited to stroke, bleeding, vascular damage, tamponade, perforation, damage to the esophagus, lungs, and other structures, pulmonary vein stenosis, worsening renal function, and death. The patient understands these risk and wishes to proceed. We will therefore plan repeat catheter ablation at the next available time.  He will need cardiac CT to evaluate for pulmonary vein stensis and better define the pulmonary venous anatomy prior to the procedure.  We will increase coreg today to 25mg  two times a day   Problem # 2:  COUMADIN THERAPY (ICD-V58.61) goal INR 2-3  Problem # 3:  ESSENTIAL HYPERTENSION, BENIGN (ICD-401.1)  stable  His updated medication list for this problem includes:    Lisinopril 5 Mg Tabs (Lisinopril) .Marland Kitchen... 1 tab once daily    Aspirin 81 Mg Tbec (Aspirin) .Marland Kitchen... Take one tablet by mouth daily    Carvedilol 25 Mg Tabs (Carvedilol) ..... One by mouth two times a day    Cardizem 30 Mg Tabs (Diltiazem hcl) ..... One tablet by mouth every 6 hours for break through afib  Other Orders: Cardiac CTA (Cardiac CTA)  Patient Instructions: 1)  Your physician has recommended you make the following change in your medication: Increase Coreg to 25mg  two times a day 2)  Will schedule ablation and call you with date and times 3)  Will need TEE the day prior and Cardiac CT one week prior 4)  Looking at 05/26/10 for ablation TEE on 05/25/10 Prescriptions: CARDIZEM 30 MG TABS (DILTIAZEM HCL) one tablet by mouth every 6 hours for break through afib  #30 x 1   Entered by:   Dennis Bast, RN, BSN   Authorized by:    Hillis Range, MD   Signed by:   Dennis Bast, RN, BSN on 04/25/2010   Method used:   Electronically to        [facility] [phone]* (retail)       [address] [place], [place]  [address]       Ph: [phone]       Fax: [phone]   RxID:   [id number] CARVEDILOL 25 MG TABS (CARVEDILOL) one by mouth two times a day  #60 x 6   Entered by:   Dennis Bast, RN, BSN   Authorized by:   Hillis Range, MD   Signed by:   Dennis Bast, RN, BSN on 04/25/2010   Method used:   Electronically to        [facility] [phone]* (retail)       [address] [place], [place]  [address]       Ph: [phone]       Fax: [phone]   RxID:   [id number]

## 2010-10-04 NOTE — Assessment & Plan Note (Signed)
Summary: per check out/sf   Visit Type:  3 mo f/u Referring Provider:  Valera Castle, MD Primary Provider:  Olivia Canter MD  CC:  no cardiac complaints today.  History of Present Illness: Mr Couchman returns today for evaluation management of his coronary artery disease , paroxysmal atrial fibrillation, anticoagulation, hypertension, hyperlipidemia, and previous anterior Nikiah Goin MI.  He is having no symptoms of angina or ischemia. He continues to exercise 4 hours a week. He is extremely compliant with his medications and diet. Recent lipids are at goal from his primary care which I reviewed today. He remains on low-dose simvastatin.  He has had no clinical atrial fibrillation. He is status post ablation in June of 2010. He denies any bleeding   Current Medications (verified): 1)  Simvastatin 20 Mg Tabs (Simvastatin) .Marland Kitchen.. 1 Tab Once Daily 2)  Lisinopril 5 Mg Tabs (Lisinopril) .Marland Kitchen.. 1 Tab Once Daily 3)  Aspirin 81 Mg Tbec (Aspirin) .... Take One Tablet By Mouth Daily 4)  Carvedilol 12.5 Mg Tabs (Carvedilol) .... Take One Tablet By Mouth Twice A Day 5)  Fish Oil 1000 Mg Caps (Omega-3 Fatty Acids) .... 2 Cap Once Daily 6)  Fiber Supplement .... Use As Directed Once Daily 7)  Nitroglycerin 0.4 Mg Subl (Nitroglycerin) .... One Tablet Under Tongue Every 5 Minutes As Needed For Chest Pain---May Repeat Times Three 8)  Warfarin Sodium 4 Mg Tabs (Warfarin Sodium) .... Use As Directed By Anticoagulation Clinic 9)  Flomax 0.4 Mg Caps (Tamsulosin Hcl) .... Take 1 Tablet Daily 10)  Multivitamins   Tabs (Multiple Vitamin) .... Take One Tablet By Mouth Once Daily.  Allergies (verified): No Known Drug Allergies  Past History:  Past Medical History: Last updated: 05/05/2009 Persistent Atrial Fibrillation s/p ablation CAD  s/p PCI x3 2006 Cerebrovascular Disease Diabetes Type 2 Hyperlipidemia Hypertension Prior morbid obesity, has lost 120 lbs with lifestyle modification OSA, improved with weight  loss  Past Surgical History: Last updated: 05/05/2009 eye surgery at 67 months of age descending testicle surgery at age 68 afib ablation 02/08/09  Family History: Last updated: 01/20/2009 Alzheimers dementia (mother)  Social History: Last updated: 01/20/2009 Lives in [place] with spouse.  Full Time mortgage collections. Married.  Wife manages [employer]. Tobacco Use - No.  Alcohol Use - no Regular Exercise - yes Drug Use - no  Risk Factors: Exercise: yes (09/02/2008)  Risk Factors: Smoking Status: never (09/02/2008)  Review of Systems       negative other than history of present illness  Vital Signs:  Patient profile:   50 year old male Height:      72 inches Weight:      180 pounds BMI:     24.50 Pulse rate:   66 / minute Pulse rhythm:   regular BP sitting:   110 / 70  (left arm) Cuff size:   large  Vitals Entered By: Danielle Rankin, CMA (March 15, 2010 8:39 AM)  Physical Exam  General:  Well developed, well nourished, in no acute distress. Head:  normocephalic and atraumatic Eyes:  wears glasses, otherwise normal Neck:  Neck supple, no JVD. No masses, thyromegaly or abnormal cervical nodes. Chest Iam Lipson:  no deformities or breast masses noted Lungs:  Clear bilaterally to auscultation and percussion. Heart:  Non-displaced PMI, chest non-tender; regular rate and rhythm, S1, S2 without murmurs, rubs or gallops. Carotid upstroke normal, no bruit. Normal abdominal aortic size, no bruits. Femorals normal pulses, no bruits. Pedals normal pulses. No edema, no varicosities. Abdomen:  Bowel sounds positive; abdomen soft and non-tender without masses, organomegaly, or hernias noted. No hepatosplenomegaly. Msk:  Back normal, normal gait. Muscle strength and tone normal. Pulses:  pulses normal in all 4 extremities Extremities:  No clubbing or cyanosis. Neurologic:  Alert and oriented x 3. Skin:  Intact without lesions or rashes. Psych:  Normal affect.   Impression &  Recommendations:  Problem # 1:  CAD (ICD-414.00) Assessment Unchanged  His updated medication list for this problem includes:    Lisinopril 5 Mg Tabs (Lisinopril) .Marland Kitchen... 1 tab once daily    Aspirin 81 Mg Tbec (Aspirin) .Marland Kitchen... Take one tablet by mouth daily    Carvedilol 12.5 Mg Tabs (Carvedilol) .Marland Kitchen... Take one tablet by mouth twice a day    Nitroglycerin 0.4 Mg Subl (Nitroglycerin) ..... One tablet under tongue every 5 minutes as needed for chest pain---may repeat times three    Warfarin Sodium 4 Mg Tabs (Warfarin sodium) ..... Use as directed by anticoagulation clinic  Problem # 2:  ATRIAL FIBRILLATION (ICD-427.31) Assessment: Improved  His updated medication list for this problem includes:    Aspirin 81 Mg Tbec (Aspirin) .Marland Kitchen... Take one tablet by mouth daily    Carvedilol 12.5 Mg Tabs (Carvedilol) .Marland Kitchen... Take one tablet by mouth twice a day    Warfarin Sodium 4 Mg Tabs (Warfarin sodium) ..... Use as directed by anticoagulation clinic  Problem # 3:  COUMADIN THERAPY (ICD-V58.61) Assessment: Unchanged INR is therapeutic today.  Problem # 4:  MIXED HYPERLIPIDEMIA (ICD-272.2) Assessment: Improved  His updated medication list for this problem includes:    Simvastatin 20 Mg Tabs (Simvastatin) .Marland Kitchen... 1 tab once daily  Problem # 5:  ESSENTIAL HYPERTENSION, BENIGN (ICD-401.1) Assessment: Improved  His updated medication list for this problem includes:    Lisinopril 5 Mg Tabs (Lisinopril) .Marland Kitchen... 1 tab once daily    Aspirin 81 Mg Tbec (Aspirin) .Marland Kitchen... Take one tablet by mouth daily    Carvedilol 12.5 Mg Tabs (Carvedilol) .Marland Kitchen... Take one tablet by mouth twice a day  Patient Instructions: 1)  Your physician recommends that you schedule a follow-up appointment in: 6 months with Dr. Daleen Squibb 2)  Your physician recommends that you continue on your current medications as directed. Please refer to the Current Medication list given to you today. Prescriptions: NITROGLYCERIN 0.4 MG SUBL (NITROGLYCERIN)  One tablet under tongue every 5 minutes as needed for chest pain---may repeat times three  #25 x 9   Entered by:   Danielle Rankin, CMA   Authorized by:   Gaylord Shih, MD, [facility]   Signed by:   Danielle Rankin, CMA on 03/15/2010   Method used:   Electronically to        [facility] [phone]* (retail)       [address] [place], [place]  [address]       Ph: [phone]       Fax: [phone]   RxID:   [id number]

## 2010-10-04 NOTE — Letter (Signed)
Summary: Return To Work  [facility], Main Office  [address]   [place], [place] [address]   Phone: [phone]  Fax: [phone]    07/18/2010  TO: WHOM IT MAY CONCERN   RE: Angela T Pomplun [address] [id number]   The above named individual is under my medical care and will be out of work for a cardiac procedure that has been rescheduled.  He will be out from 07/21/10 - 07/30/10.  If you have any further questions or need additional information, please call.     Sincerely,    Dr. Hillis Range, MD/Kelly Darl Pikes, RN, BSN  Appended Document: Return To Work faxed to [phone]   MWN:UUVO Sherrie George

## 2010-10-04 NOTE — Letter (Signed)
Summary: Return To Work  [employer], Main Office  [address]   [place], [place] [address]   Phone: [phone]  Fax: [phone]    05/26/2010  TO: WHOM IT MAY CONCERN   RE: [name] [address] [id number]   The above named individual is under my medical care and may return to work on: 05/27/10.  If you have any further questions or need additional information, please call.     Sincerely,    Dr. Hillis Range, MD/Kelly Darl Pikes, RN

## 2010-10-04 NOTE — Medication Information (Signed)
Summary: rov/tm  Anticoagulant Therapy  Managed by: Cloyde Reams, RN, BSN Referring MD: Valera Castle MD PCP: Olivia Canter MD Supervising MD: Myrtis Ser MD, Tinnie Gens Indication 1: Atrial Fibrillation (ICD-427.31) Indication 2: Mitral Valve Regurgitation (ICD-Regurg) Lab Used: [facility] Site: [facility] INR POC 2.1 INR RANGE 2 - 3  Dietary changes: no    Health status changes: no    Bleeding/hemorrhagic complications: no    Recent/future hospitalizations: no    Any changes in medication regimen? no    Recent/future dental: no  Any missed doses?: no       Is patient compliant with meds? yes       Allergies: No Known Drug Allergies  Anticoagulation Management History:      The patient is taking warfarin and comes in today for a routine follow up visit.  Negative risk factors for bleeding include an age less than 14 years old.  The bleeding index is 'low risk'.  Positive CHADS2 values include History of HTN.  Negative CHADS2 values include Age > 51 years old.  The start date was 10/16/2006.  His last INR was 2.4 ratio.  Anticoagulation responsible provider: Myrtis Ser MD, Tinnie Gens.  INR POC: 2.1.  Cuvette Lot#: [id number].  Exp: 06/2011.    Anticoagulation Management Assessment/Plan:      The patient's current anticoagulation dose is Warfarin sodium 4 mg tabs: Use as directed by Anticoagulation Clinic.  The target INR is 2 - 3.  The next INR is due 05/10/2010.  Anticoagulation instructions were given to patient.  Results were reviewed/authorized by Cloyde Reams, RN, BSN.  He was notified by Cloyde Reams RN.         Prior Anticoagulation Instructions: INR 2.5 Continue 6mg s daily except 8mg s on Wednesdays and Saturdays. Recheck in 4 weeks.   Current Anticoagulation Instructions: INR 2.1  Continue on same dosage 1.5 tablets daily except 2 tablets on Wednesdays and Saturdays.  Recheck in 4 weeks.

## 2010-10-04 NOTE — Progress Notes (Signed)
Summary: speak to nurse  Phone Note Call from Patient Call back at [phone]   Reason for Call: Talk to Nurse Summary of Call: request to speak to [facility] Initial call taken by: Migdalia Dk,  February 18, 2010 8:07 AM  Follow-up for Phone Call        [facility] to call back Dennis Bast, RN, BSN  February 18, 2010 8:50 AM spoke with pt wanted his Carvedilol called in.  He says it was already done Dennis Bast, RN, BSN  February 18, 2010 5:07 PM

## 2010-10-04 NOTE — Progress Notes (Signed)
  Phone Note Call from Patient   Call For: Dr Juanito Doom Reason for Call: Acute Illness Action Taken: Patient advised to go to ER Details for Reason: Afib Summary of Call: Pt was sitting at rest and had sudden onset of tachypalpitations - no other real Sx. His HR is higher than usual when in afib, 120s at rest. Pt felt general malaise then laid down and within 15" developed chills followed by tachycardia. Pt uncomfortable more than usual when in afib and feels he needs to be seen in ER. Advised him to come in, not to drive. INR 2.1 yest.  Did not take am Coreg until he got home from work at about 6pm.

## 2010-10-04 NOTE — Medication Information (Signed)
Summary: ccr/ gd  Anticoagulant Therapy  Managed by: Bethena Midget, RN, BSN Referring MD: Valera Castle MD PCP: Olivia Canter MD Supervising MD: Eden Emms MD, Theron Arista Indication 1: Atrial Fibrillation (ICD-427.31) Indication 2: Mitral Valve Regurgitation (ICD-Regurg) Lab Used: [facility] Site: [facility] INR POC 1.9 INR RANGE 2 - 3  Dietary changes: no    Health status changes: no    Bleeding/hemorrhagic complications: no    Recent/future hospitalizations: yes       Details: Last Thursday went into Afib, went to ER and was admitted and cardioverted on Friday by Dr Jens Som.   Any changes in medication regimen? no    Recent/future dental: no  Any missed doses?: no       Is patient compliant with meds? yes      Comments: Seeing Dr [name] today  Allergies: No Known Drug Allergies  Anticoagulation Management History:      The patient is taking warfarin and comes in today for a routine follow up visit.  Negative risk factors for bleeding include an age less than 82 years old.  The bleeding index is 'low risk'.  Positive CHADS2 values include History of HTN.  Negative CHADS2 values include Age > 73 years old.  The start date was 10/16/2006.  His last INR was 2.4 ratio.  Anticoagulation responsible provider: Eden Emms MD, Theron Arista.  INR POC: 1.9.  Cuvette Lot#: [id number].  Exp: 06/2011.    Anticoagulation Management Assessment/Plan:      The patient's current anticoagulation dose is Warfarin sodium 4 mg tabs: Use as directed by Anticoagulation Clinic.  The target INR is 2 - 3.  The next INR is due 05/04/2010.  Anticoagulation instructions were given to patient.  Results were reviewed/authorized by Bethena Midget, RN, BSN.  He was notified by Bethena Midget, RN, BSN.         Prior Anticoagulation Instructions: INR 2.1  Continue on same dosage 1.5 tablets daily except 2 tablets on Wednesdays and Saturdays.  Recheck in 4 weeks.    Current Anticoagulation Instructions: INR 1.9 Today 10mg s then  change dose to 6mg s everyday except 8mg s on Mondays, Wednesdays and Fridays. Recheck in 2 weeks.

## 2010-10-04 NOTE — Medication Information (Signed)
Summary: rov/sp  Anticoagulant Therapy  Managed by: Bethena Midget, RN, BSN Referring MD: Valera Castle MD PCP: Olivia Canter MD Supervising MD: Riley Kill MD, Maisie Fus Indication 1: Atrial Fibrillation (ICD-427.31) Indication 2: Mitral Valve Regurgitation (ICD-Regurg) Lab Used: [facility] Site: [facility] INR POC 1.6 INR RANGE 2 - 3  Dietary changes: no    Health status changes: no    Bleeding/hemorrhagic complications: no    Recent/future hospitalizations: no    Any changes in medication regimen? no    Recent/future dental: no  Any missed doses?: no       Is patient compliant with meds? yes       Allergies: No Known Drug Allergies  Anticoagulation Management History:      The patient is taking warfarin and comes in today for a routine follow up visit.  Negative risk factors for bleeding include an age less than 64 years old.  The bleeding index is 'low risk'.  Positive CHADS2 values include History of HTN.  Negative CHADS2 values include Age > 87 years old.  The start date was 10/16/2006.  His last INR was 2.4 ratio.  Anticoagulation responsible provider: Riley Kill MD, Maisie Fus.  INR POC: 1.6.  Cuvette Lot#: [id number].  Exp: 04/2011.    Anticoagulation Management Assessment/Plan:      The patient's current anticoagulation dose is Warfarin sodium 4 mg tabs: Use as directed by Anticoagulation Clinic.  The target INR is 2 - 3.  The next INR is due 02/16/2010.  Anticoagulation instructions were given to patient.  Results were reviewed/authorized by Bethena Midget, RN, BSN.  He was notified by Bethena Midget, RN, BSN.         Prior Anticoagulation Instructions: INR 1.8  Increase dose to 1 1/2 tablet every day except 2 tablets on Thursday.   Current Anticoagulation Instructions: INR 1.6 Today take 8mg s then change dose to 6mg s daily except 8mg s on Wednesdays and Saturdays. Recheck in 2 weeks.

## 2010-10-04 NOTE — Medication Information (Signed)
Summary: rov/cb  Anticoagulant Therapy  Managed by: Elaina Pattee, PharmD Referring MD: Valera Castle MD PCP: Olivia Canter MD Supervising MD: Gala Romney MD, Reuel Boom Indication 1: Atrial Fibrillation (ICD-427.31) Indication 2: Mitral Valve Regurgitation (ICD-Regurg) Lab Used: [facility] Site: [facility] INR POC 2.1 INR RANGE 2 - 3  Dietary changes: no    Health status changes: no    Bleeding/hemorrhagic complications: no    Recent/future hospitalizations: no    Any changes in medication regimen? no    Recent/future dental: no  Any missed doses?: no       Is patient compliant with meds? yes       Allergies: No Known Drug Allergies  Anticoagulation Management History:      The patient is taking warfarin and comes in today for a routine follow up visit.  Negative risk factors for bleeding include an age less than 37 years old.  The bleeding index is 'low risk'.  Positive CHADS2 values include History of HTN.  Negative CHADS2 values include Age > 24 years old.  The start date was 10/16/2006.  His last INR was 2.4 ratio.  Anticoagulation responsible provider: Bensimhon MD, Reuel Boom.  INR POC: 2.1.  Cuvette Lot#: [id number].  Exp: 04/2011.    Anticoagulation Management Assessment/Plan:      The patient's current anticoagulation dose is Warfarin sodium 4 mg tabs: Use as directed by Anticoagulation Clinic.  The target INR is 2 - 3.  The next INR is due 03/15/2010.  Anticoagulation instructions were given to patient.  Results were reviewed/authorized by Elaina Pattee, PharmD.  He was notified by Elaina Pattee, PharmD.         Prior Anticoagulation Instructions: INR 2.3. Take 1.5 tablets daily except 2 tablets on Wed and Sat. Recheck in 2-3 weeks.  Current Anticoagulation Instructions: INR 2.1. Take 1.5 tablets daily except 2 tablets on Wed and Sat. Recheck in 4 weeks.

## 2010-10-06 NOTE — Progress Notes (Signed)
  Phone Note Call from Patient Call back at Home Phone [phone] Call back at [phone]   Caller: Patient Reason for Call: Talk to Nurse Summary of Call: question re meds. pt is having chest discomfort. Initial call taken by: Roe Coombs,  August 16, 2010 4:04 PM

## 2010-10-06 NOTE — Assessment & Plan Note (Signed)
Summary: shoulder/chest pain/pla   Referring Provider:  Valera Castle, MD Primary Provider:  Olivia Canter MD  CC:  Chest Discomfort x 2 days w/ radiation back/(L) arm, relief yesterday after 1 NTG, and now a 3/10 scale.  History of Present Illness: Primary Cardiologist:  Dr. Valera Castle Primary Electrophysiologist:  Dr. Hillis Range  [name] is a 50 yo male with a history of coronary artery disease, status post anterior myocardial infarction in October 2006 treated with drug-eluting stents to the proximal and mid LAD.  His last heart catheterization in September of 2006 demonstrated 2 jailed diagonals, a patent LAD and normal circumflex and RCA.  He has a history of atrial fibrillation.  He underwent a second ablation for atrial fibrillation in November of this year.  His last echocardiogram was done in November of 2011 and demonstrated normal LV function with no wall motion abnormalities.  He called the office today with left shoulder pain that was concerning for him.  He was added onto my schedule.  He denies any precipitating events.  His discomfort started yesterday around 5 PM.  He did initially have some radiation to his chest on the left side.  This did not last very long.  The pain around his left scapula has been fairly constant.  He denies any change with exertion.  He denies any associated dyspnea.  He denies orthopnea, PND or pedal edema.  He denies syncope.  His pain with his heart attack was located in his left arm.  These symptoms are completely unlike his prior angina.  Current Medications (verified): 1)  Simvastatin 20 Mg Tabs (Simvastatin) .Marland Kitchen.. 1 Tab Once Daily 2)  Lisinopril 5 Mg Tabs (Lisinopril) .Marland Kitchen.. 1 Tab Once Daily 3)  Aspirin 81 Mg Tbec (Aspirin) .... Take One Tablet By Mouth Daily 4)  Carvedilol 25 Mg Tabs (Carvedilol) .... One By Mouth Two Times A Day 5)  Fish Oil 1000 Mg Caps (Omega-3 Fatty Acids) .... 2 Cap Once Daily 6)  Fiber Supplement .... Use As Directed  Once Daily 7)  Nitroglycerin 0.4 Mg Subl (Nitroglycerin) .... One Tablet Under Tongue Every 5 Minutes As Needed For Chest Pain---May Repeat Times Three 8)  Pradaxa 150 Mg Caps (Dabigatran Etexilate Mesylate) .... One By Mouth Bid 9)  Flomax 0.4 Mg Caps (Tamsulosin Hcl) .... Take 1 Tablet Daily 10)  Multivitamins   Tabs (Multiple Vitamin) .... Take One Tablet By Mouth Once Daily. 11)  Multaq 400 Mg Tabs (Dronedarone Hcl) .... Two Times A Day  Allergies (verified): No Known Drug Allergies  Past History:  Past Medical History: Last updated: 05/05/2009 Persistent Atrial Fibrillation s/p ablation CAD  s/p PCI x3 2006 Cerebrovascular Disease Diabetes Type 2 Hyperlipidemia Hypertension Prior morbid obesity, has lost 120 lbs with lifestyle modification OSA, improved with weight loss  Social History: Last updated: 01/20/2009 Lives in [place] with spouse.  Full Time mortgage collections. Married.  Wife manages [employer]. Tobacco Use - No.  Alcohol Use - no Regular Exercise - yes Drug Use - no  Review of Systems       As per  the HPI.  All other systems reviewed and negative.   Vital Signs:  Patient profile:   50 year old male Height:      72 inches Weight:      186 pounds Pulse rate:   60 / minute Pulse rhythm:   regular BP sitting:   104 / 58  (left arm) Cuff size:   regular  Vitals Entered By:  Stanton Kidney, EMT-P (August 17, 2010 11:34 AM)  Physical Exam  General:  Well nourished, well developed, in no acute distress HEENT: normal Neck: no JVD Cardiac:  distant S1, S2; RRR; no murmur Lungs:  clear to auscultation bilaterally, no wheezing, rhonchi or rales Abd: soft, nontender, no hepatomegaly Ext: no edema Vascular: no carotid  bruits Skin: warm and dry Neuro:  CNs 2-12 intact, no focal abnormalities noted MSK:  tenderness of paraspinal muscles just medial to scapula   EKG  Procedure date:  08/17/2010  Findings:      Normal Sinus Rhythm Heart rate  60 Normal axis No ischemic changes No change since prior tracing dated 07/11/10  Impression & Recommendations:  Problem # 1:  ARM PAIN, LEFT (ICD-729.5) His symptoms are very atypical.  However, his prior anginal symptom was left arm pain.  This seems to be more consistent with MSK type pain.  I believe he strained his shoulder.  His ECG is normal.  Given his history, I have recommended we draw a Troponin today.  We can reassure him if this is negative.  If it is elevated, we will direct him to the ED.  Problem # 2:  CAD (ICD-414.00)  As above. His last nuclear study was in 2009. Continue ASA.  Problem # 3:  ATRIAL FIBRILLATION (ICD-427.31)  Maintaining NSR.  Problem # 4:  ESSENTIAL HYPERTENSION, BENIGN (ICD-401.1)  Controlled.  Other Orders: T-Troponin I ([id number])    Appended Document: shoulder/chest pain/pla  Reviewed Juanito Doom, MD

## 2010-10-06 NOTE — Assessment & Plan Note (Signed)
Summary: [name]   Visit Type:  Follow-up Referring Provider:  Valera Castle, MD Primary Provider:  Olivia Canter MD   History of Present Illness: The patient presents today for routine electrophysiology followup. He reports doing very well since his recent afib ablation.  He denies procedure related complications and is maintaining sinus rhythm.  The patient denies symptoms of palpitations, chest pain, shortness of breath, orthopnea, PND, lower extremity edema, dizziness, presyncope, syncope, or neurologic sequela. The patient is tolerating medications without difficulties and is otherwise without complaint today.   Current Medications (verified): 1)  Simvastatin 20 Mg Tabs (Simvastatin) .Marland Kitchen.. 1 Tab Once Daily 2)  Lisinopril 5 Mg Tabs (Lisinopril) .Marland Kitchen.. 1 Tab Once Daily 3)  Aspirin 81 Mg Tbec (Aspirin) .... Take One Tablet By Mouth Daily 4)  Carvedilol 25 Mg Tabs (Carvedilol) .... One By Mouth Two Times A Day 5)  Fish Oil 1000 Mg Caps (Omega-3 Fatty Acids) .... 2 Cap Once Daily 6)  Fiber Supplement .... Use As Directed Once Daily 7)  Nitroglycerin 0.4 Mg Subl (Nitroglycerin) .... One Tablet Under Tongue Every 5 Minutes As Needed For Chest Pain---May Repeat Times Three 8)  Pradaxa 150 Mg Caps (Dabigatran Etexilate Mesylate) .... One By Mouth Bid 9)  Flomax 0.4 Mg Caps (Tamsulosin Hcl) .... Take 1 Tablet Daily 10)  Multivitamins   Tabs (Multiple Vitamin) .... Take One Tablet By Mouth Once Daily. 11)  Multaq 400 Mg Tabs (Dronedarone Hcl) .... Two Times A Day  Allergies (verified): No Known Drug Allergies  Past History:  Past Medical History: Reviewed history from 05/05/2009 and no changes required. Persistent Atrial Fibrillation s/p ablation CAD  s/p PCI x3 2006 Cerebrovascular Disease Diabetes Type 2 Hyperlipidemia Hypertension Prior morbid obesity, has lost 120 lbs with lifestyle modification OSA, improved with weight loss  Past Surgical History: eye surgery at 52  months of age descending testicle surgery at age 71 afib ablation 02/08/09 and 07/21/10  Social History: Reviewed history from 01/20/2009 and no changes required. Lives in [place] with spouse.  Full Time mortgage collections. Married.  Wife manages [employer]. Tobacco Use - No.  Alcohol Use - no Regular Exercise - yes Drug Use - no  Vital Signs:  Patient profile:   50 year old male Height:      72 inches Weight:      185 pounds Pulse rate:   64 / minute BP sitting:   102 / 70  (right arm)  Vitals Entered By: [name], CMA (September 21, 2010 12:25 PM)  Physical Exam  General:  Well developed, well nourished, in no acute distress. Head:  normocephalic and atraumatic Eyes:  PERRLA/EOM intact; conjunctiva and lids normal. Mouth:  Teeth, gums and palate normal. Oral mucosa normal. Neck:  supple Lungs:  Clear bilaterally to auscultation and percussion. Heart:  Non-displaced PMI, chest non-tender; regular rate and rhythm, S1, S2 without murmurs, rubs or gallops. Carotid upstroke normal, no bruit. Normal abdominal aortic size, no bruits. Femorals normal pulses, no bruits. Pedals normal pulses. No edema, no varicosities. Abdomen:  Bowel sounds positive; abdomen soft and non-tender without masses, organomegaly, or hernias noted. No hepatosplenomegaly. Msk:  Back normal, normal gait. Muscle strength and tone normal. Pulses:  pulses normal in all 4 extremities Extremities:  No clubbing or cyanosis. Neurologic:  Alert and oriented x 3.   EKG  Procedure date:  09/21/2010  Findings:      sinus rhythm 61 bpm, normal ekg  Impression & Recommendations:  Problem # 1:  ATRIAL FIBRILLATION (ICD-427.31) no symptoms of afib post ablation stop multaq continue pradaxa  Problem # 2:  ESSENTIAL HYPERTENSION, BENIGN (ICD-401.1) stable no changes  Problem # 3:  CAD (ICD-414.00) no symptoms of ischemia  Other Orders: EKG w/ Interpretation (93000)  Patient Instructions: 1)  Your  physician wants you to follow-up in: 6 months  You will receive a reminder letter in the mail two months in advance. If you don't receive a letter, please call our office to schedule the follow-up appointment. 2)  Your physician has recommended you make the following change in your medication: stop Multaq

## 2010-10-06 NOTE — Progress Notes (Signed)
Summary: question re meds  Phone Note Call from Patient Call back at [phone]   Caller: Patient Reason for Call: Talk to Nurse Summary of Call: question re meds. pt is having chest discomfort. Initial call taken by: Roe Coombs,  August 16, 2010 4:05 PM  Follow-up for Phone Call        called the call back number listed - the phone rang until it quit ringing and there was not a voicemail.  Called home phone number and left a voicemail to call back in the am or if he is having immediate problems to call the MD on call.  His office is closed. Follow-up by: Charolotte Capuchin, RN,  August 16, 2010 5:28 PM  Additional Follow-up for Phone Call Additional follow up Details #1::        Called to check on pt. He reports he had left shoulder and left chest pain starting yesterday afternoon.  Pain lasted into evening and during night. Able to go into work this AM.  States pain is not the same as pain he had with MI. Pain mostly in left shoulder area but pt denies any recent heavy use of shoulder/arm.  States his heart rate is regular.  Pt given appt. to see Tereso Newcomer, PA-C at 11:45 today. Additional Follow-up by: Dossie Arbour, RN, BSN,  August 17, 2010 10:06 AM

## 2010-10-06 NOTE — Progress Notes (Signed)
  Phone Note Outgoing Call   Summary of Call: Tom, I saw Mr. Crowson today for some left shoulder pain for > 12 hours. I suspect it is MSK.  It sounds like he strained his shoulder.  His MI pain was left arm pain and he was concerned.  I went ahead and drew a Troponin. He sees you in January.  His last nuclear study was less than 3 years ago . . . in 2009 and was ok. I discussed with him arranging a follow up study now.  He wanted to wait until his appointment with you. I told him I would check with you. Would you prefer to get a myoview now or just see him in January and decide then? Of note, he just had his second AFib ablation in Nov and sees Fayrene Fearing in Jan as well. Thanks. Initial call taken by: Tereso Newcomer PA-C,  August 17, 2010 1:47 PM  Follow-up for Phone Call        sound musculoskeletal, let me see in Jan Follow-up by: Gaylord Shih, MD, [facility],  August 19, 2010 8:43 AM

## 2010-10-07 NOTE — Letter (Signed)
Summary: Midwife  [employer] Authorization   Imported By: Roderic Ovens 10/06/2009 14:18:02  _____________________________________________________________________  External Attachment:    Type:   Image     Comment:   External Document

## 2010-11-12 IMAGING — CR DG CHEST 1V PORT
1 series · 1 of 1 positions shown · non-contrast
Comparison: 02/13/2008

CLINICAL DATA: Chest pain.  History of myocardial infarction.
Three cardiac stents.  History of hypertension, diabetes.
Nonsmoker.

PORTABLE CHEST - 1 VIEW

[AP]
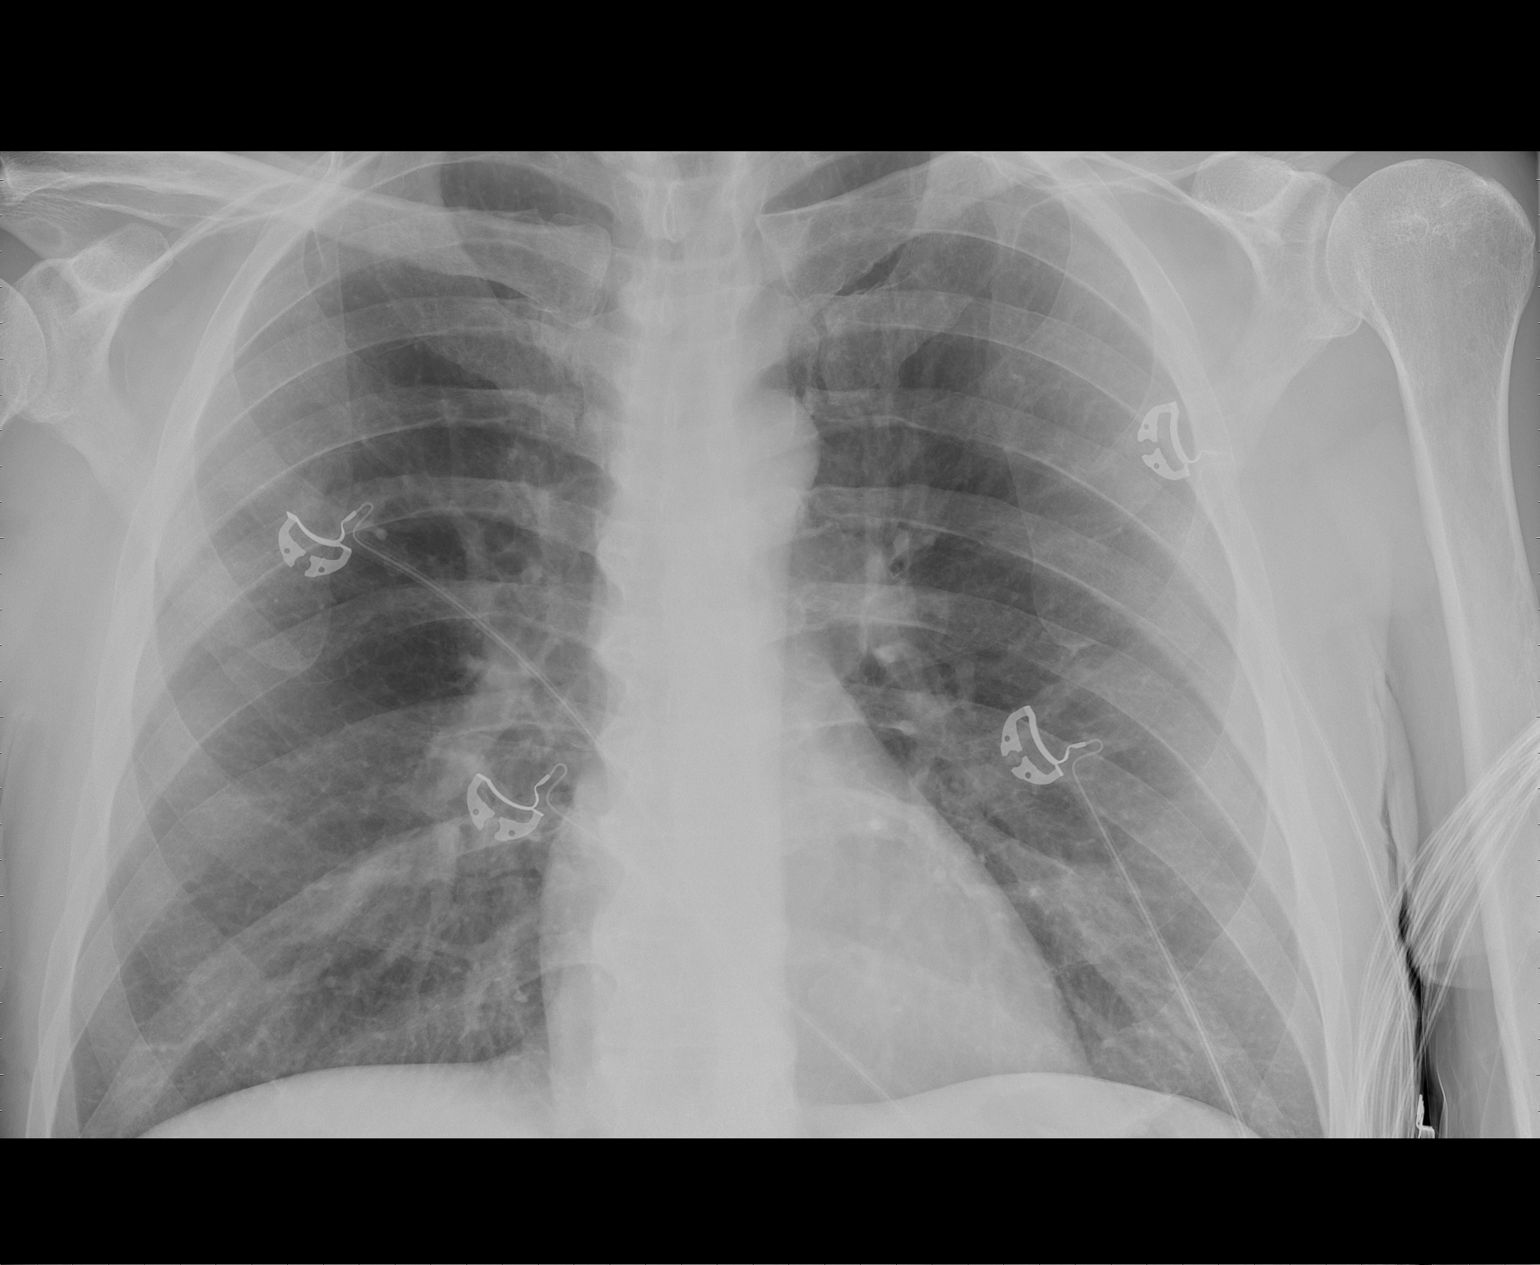

[1 of 1 positions shown; findings below may reference images not displayed]

FINDINGS: Heart size is normal.  There are no focal consolidations
or pleural effusions.  No evidence for pulmonary edema.
Degenerative changes are seen in the spine.
IMPRESSION: No evidence for acute cardiopulmonary abnormality.

## 2010-11-15 LAB — GLUCOSE, CAPILLARY
Glucose-Capillary: 100 mg/dL — ABNORMAL HIGH (ref 70–99)
Glucose-Capillary: 126 mg/dL — ABNORMAL HIGH (ref 70–99)
Glucose-Capillary: 85 mg/dL (ref 70–99)
Glucose-Capillary: 88 mg/dL (ref 70–99)

## 2010-11-15 LAB — MRSA PCR SCREENING: MRSA by PCR: NEGATIVE

## 2010-11-17 LAB — PROTIME-INR
INR: 3.43 — ABNORMAL HIGH (ref 0.00–1.49)
Prothrombin Time: 34.6 seconds — ABNORMAL HIGH (ref 11.6–15.2)

## 2010-11-18 LAB — DIFFERENTIAL
Basophils Absolute: 0.1 K/uL (ref 0.0–0.1)
Basophils Relative: 1 % (ref 0–1)
Eosinophils Absolute: 0.5 K/uL (ref 0.0–0.7)
Eosinophils Relative: 8 % — ABNORMAL HIGH (ref 0–5)
Lymphocytes Relative: 40 % (ref 12–46)
Lymphs Abs: 2.5 K/uL (ref 0.7–4.0)
Monocytes Absolute: 0.9 K/uL (ref 0.1–1.0)
Monocytes Relative: 14 % — ABNORMAL HIGH (ref 3–12)
Neutro Abs: 2.4 10*3/uL (ref 1.7–7.7)
Neutrophils Relative %: 37 % — ABNORMAL LOW (ref 43–77)

## 2010-11-18 LAB — BASIC METABOLIC PANEL
GFR calc non Af Amer: >60 mL/min (ref >60)
Potassium: 4.6 mEq/L (ref 3.5–5.1)
Sodium: 139 mEq/L (ref 135–145)

## 2010-11-18 LAB — CARDIAC PANEL(CRET KIN+CKTOT+MB+TROPI)
CK, MB: 1.2 ng/mL (ref 0.3–4.0)
Total CK: 57 U/L (ref 7–232)
Troponin I: 0.01 ng/mL (ref 0.00–0.06)

## 2010-11-18 LAB — CBC
Platelets: 133 K/uL — ABNORMAL LOW (ref 150–400)
WBC: 6.4 10*3/uL (ref 4.0–10.5)

## 2010-11-18 LAB — BASIC METABOLIC PANEL WITH GFR
BUN: 15 mg/dL (ref 6–23)
CO2: 28 meq/L (ref 19–32)
Calcium: 9.3 mg/dL (ref 8.4–10.5)
Chloride: 105 meq/L (ref 96–112)
Creatinine, Ser: 0.79 mg/dL (ref 0.4–1.5)
GFR calc Af Amer: >60 mL/min (ref >60)
Glucose, Bld: 97 mg/dL (ref 70–99)

## 2010-11-18 LAB — MRSA PCR SCREENING: MRSA by PCR: NEGATIVE

## 2010-11-18 LAB — TSH: TSH: 2.435 u[IU]/mL (ref 0.350–4.500)

## 2010-11-18 LAB — CK TOTAL AND CKMB (NOT AT ARMC): CK, MB: 1.2 ng/mL (ref 0.3–4.0)

## 2010-11-18 LAB — POCT CARDIAC MARKERS
CKMB, poc: <1 ng/mL — ABNORMAL LOW (ref 1.0–8.0)
Myoglobin, poc: 58.8 ng/mL (ref 12–200)
Troponin i, poc: <0.05 ng/mL (ref 0.00–0.09)

## 2010-11-18 LAB — LIPID PANEL
HDL: 39 mg/dL — ABNORMAL LOW (ref >39)
VLDL: 6 mg/dL (ref 0–40)

## 2010-11-18 LAB — PROTIME-INR
INR: 1.96 — ABNORMAL HIGH (ref 0.00–1.49)
Prothrombin Time: 22.5 s — ABNORMAL HIGH (ref 11.6–15.2)

## 2010-11-18 LAB — APTT: aPTT: 35 seconds (ref 24–37)

## 2010-11-18 LAB — HEPARIN LEVEL (UNFRACTIONATED): Heparin Unfractionated: 0.1 IU/mL — ABNORMAL LOW (ref 0.30–0.70)

## 2010-11-22 LAB — HEPATIC FUNCTION PANEL
ALT: 28 U/L (ref 0–53)
AST: 30 U/L (ref 0–37)
Albumin: 3.8 g/dL (ref 3.5–5.2)
Alkaline Phosphatase: 45 U/L (ref 39–117)
Total Protein: 7.3 g/dL (ref 6.0–8.3)

## 2010-11-22 LAB — LIPID PANEL
HDL: 31 mg/dL — ABNORMAL LOW (ref >39)
LDL Cholesterol: 48 mg/dL (ref 0–99)
Triglycerides: 95 mg/dL (ref <150)
VLDL: 19 mg/dL (ref 0–40)

## 2010-11-22 LAB — PROTIME-INR
INR: 1.98 — ABNORMAL HIGH (ref 0.00–1.49)
Prothrombin Time: 23.5 seconds — ABNORMAL HIGH (ref 11.6–15.2)
Prothrombin Time: 21.2 seconds — ABNORMAL HIGH (ref 11.6–15.2)

## 2010-11-22 LAB — CBC
HCT: 42.3 % (ref 39.0–52.0)
MCHC: 34.7 g/dL (ref 30.0–36.0)
MCHC: 33.9 g/dL (ref 30.0–36.0)
MCV: 95.3 fL (ref 78.0–100.0)
MCV: 94.3 fL (ref 78.0–100.0)
Platelets: 143 10*3/uL — ABNORMAL LOW (ref 150–400)
Platelets: 101 10*3/uL — ABNORMAL LOW (ref 150–400)
Platelets: 118 10*3/uL — ABNORMAL LOW (ref 150–400)
WBC: 6.5 10*3/uL (ref 4.0–10.5)
WBC: 7.1 10*3/uL (ref 4.0–10.5)

## 2010-11-22 LAB — POCT CARDIAC MARKERS
CKMB, poc: <1 ng/mL — ABNORMAL LOW (ref 1.0–8.0)
Myoglobin, poc: 78.8 ng/mL (ref 12–200)
Troponin i, poc: <0.05 ng/mL (ref 0.00–0.09)

## 2010-11-22 LAB — DIFFERENTIAL
Basophils Relative: 1 % (ref 0–1)
Eosinophils Absolute: 0.4 10*3/uL (ref 0.0–0.7)
Eosinophils Relative: 8 % — ABNORMAL HIGH (ref 0–5)
Lymphocytes Relative: 29 % (ref 12–46)
Lymphs Abs: 1.9 10*3/uL (ref 0.7–4.0)
Neutro Abs: 3.5 10*3/uL (ref 1.7–7.7)
Neutrophils Relative %: 60 % (ref 43–77)

## 2010-11-22 LAB — BASIC METABOLIC PANEL
BUN: 19 mg/dL (ref 6–23)
BUN: 21 mg/dL (ref 6–23)
CO2: 28 mEq/L (ref 19–32)
Chloride: 110 mEq/L (ref 96–112)
Chloride: 103 mEq/L (ref 96–112)
Creatinine, Ser: 0.79 mg/dL (ref 0.4–1.5)
Creatinine, Ser: 0.8 mg/dL (ref 0.4–1.5)

## 2010-11-22 LAB — POCT I-STAT, CHEM 8
Chloride: 101 mEq/L (ref 96–112)
HCT: 43 % (ref 39.0–52.0)
Hemoglobin: 14.6 g/dL (ref 13.0–17.0)
Potassium: 5 mEq/L (ref 3.5–5.1)
Sodium: 136 mEq/L (ref 135–145)

## 2010-11-22 LAB — HEMOGLOBIN A1C: Mean Plasma Glucose: 88 mg/dL (ref <117)

## 2010-11-22 LAB — CULTURE, BLOOD (ROUTINE X 2)

## 2010-11-22 LAB — SEDIMENTATION RATE: Sed Rate: 22 mm/hr — ABNORMAL HIGH (ref 0–16)

## 2010-12-05 LAB — PROTIME-INR: Prothrombin Time: 20.8 seconds — ABNORMAL HIGH (ref 11.6–15.2)

## 2010-12-06 LAB — PROTIME-INR
INR: 1.77 — ABNORMAL HIGH (ref 0.00–1.49)
Prothrombin Time: 20.5 s — ABNORMAL HIGH (ref 11.6–15.2)
Prothrombin Time: 21.3 seconds — ABNORMAL HIGH (ref 11.6–15.2)

## 2010-12-06 LAB — GLUCOSE, CAPILLARY: Glucose-Capillary: 174 mg/dL — ABNORMAL HIGH (ref 70–99)

## 2010-12-06 LAB — POCT CARDIAC MARKERS
CKMB, poc: 3.3 ng/mL (ref 1.0–8.0)
CKMB, poc: 4.8 ng/mL (ref 1.0–8.0)
Myoglobin, poc: 362 ng/mL (ref 12–200)
Myoglobin, poc: >500 ng/mL (ref 12–200)
Troponin i, poc: <0.05 ng/mL (ref 0.00–0.09)
Troponin i, poc: <0.05 ng/mL (ref 0.00–0.09)

## 2010-12-06 LAB — CARDIAC PANEL(CRET KIN+CKTOT+MB+TROPI)
CK, MB: 4 ng/mL (ref 0.3–4.0)
Total CK: 2224 U/L — ABNORMAL HIGH (ref 7–232)
Troponin I: <0.01 ng/mL (ref 0.00–0.06)

## 2010-12-06 LAB — BASIC METABOLIC PANEL
BUN: 18 mg/dL (ref 6–23)
CO2: 27 mEq/L (ref 19–32)
Chloride: 105 mEq/L (ref 96–112)
GFR calc non Af Amer: >60 mL/min (ref >60)
Glucose, Bld: 93 mg/dL (ref 70–99)
Potassium: 4.7 mEq/L (ref 3.5–5.1)

## 2010-12-06 LAB — CBC
HCT: 45.2 % (ref 39.0–52.0)
Hemoglobin: 15.7 g/dL (ref 13.0–17.0)
MCHC: 34.7 g/dL (ref 30.0–36.0)
MCV: 92.6 fL (ref 78.0–100.0)
Platelets: 148 10*3/uL — ABNORMAL LOW (ref 150–400)
RBC: 4.89 MIL/uL (ref 4.22–5.81)
RDW: 13.2 % (ref 11.5–15.5)
WBC: 9.2 K/uL (ref 4.0–10.5)

## 2010-12-06 LAB — BASIC METABOLIC PANEL WITH GFR
Calcium: 9.6 mg/dL (ref 8.4–10.5)
Creatinine, Ser: 0.9 mg/dL (ref 0.4–1.5)
GFR calc Af Amer: >60 mL/min (ref >60)
Sodium: 143 meq/L (ref 135–145)

## 2010-12-06 LAB — CULTURE, BLOOD (ROUTINE X 2)
Culture: NO GROWTH
Culture: NO GROWTH

## 2010-12-06 LAB — APTT: aPTT: 34 s (ref 24–37)

## 2010-12-12 LAB — PROTIME-INR
INR: 1.5 (ref 0.00–1.49)
Prothrombin Time: 18.8 seconds — ABNORMAL HIGH (ref 11.6–15.2)

## 2010-12-12 LAB — GLUCOSE, CAPILLARY
Glucose-Capillary: 117 mg/dL — ABNORMAL HIGH (ref 70–99)
Glucose-Capillary: 136 mg/dL — ABNORMAL HIGH (ref 70–99)
Glucose-Capillary: 93 mg/dL (ref 70–99)

## 2010-12-12 LAB — APTT: aPTT: 32 seconds (ref 24–37)

## 2011-01-06 ENCOUNTER — Telehealth: Payer: Self-pay | Admitting: Internal Medicine

## 2011-01-06 NOTE — Telephone Encounter (Signed)
Pt wants to know if he needs to make an appt with allred since he is going to see dr wall Jan 08, 2011.

## 2011-01-06 NOTE — Telephone Encounter (Signed)
See Dr Daleen Squibb first and see what he says If no cx recall in system  Pt verbalized understanding

## 2011-01-17 NOTE — Assessment & Plan Note (Signed)
[facility]                            CARDIOLOGY OFFICE NOTE   NAME:Gluth, Dannel T                     MRN:          [id number]  DATE:02/11/2008                            DOB:          [birth year]    [name] returns today for followup.   PROBLEM LIST:  1. Paroxysmal atrial fibrillation.  He has been off amiodarone now      since October.  2. Coronary artery disease.  He is currently stable without any      angina.  Stress Myoview, September 17, 2007, showed excellent      exercise tolerance, ejection fraction 65% with no ischemia.  He has      had a previous anterior wall infarct with full recovery of his left      ventricular function.  He has had a Taxus stent x3 to the left      anterior descending.  3. Mixed hyperlipidemia.  His lipids were at goal except for an HDL of      22.7 in January 2009.   He is feeling remarkably well.  He is getting ready to go on a week of  vacation.  He has had no atrial fib.  He denies any orthopnea, PND,  peripheral edema, syncope, presyncope, or angina.   MEDICATIONS:  1. Simvastatin 20 mg a day.  2. Coumadin as directed.  3. Lisinopril 5 mg a day.  4. Multivitamin daily.  5. Aspirin 81 mg a day.  6. Coreg CR 40 mg a day.  7. Fish oil 1000 mg a day.  8. Fiber supplement.   His blood pressure is 106/65.  His pulse is 60 and regular.  His  electrocardiogram shows sinus brady with no changes.  His weight is 179.  HEENT:  Unchanged.  Carotid upstrokes equal bilateral without bruits.  No JVD.  Thyroid is  not enlarged.  Trachea is midline.  LUNGS:  Clear.  HEART:  Regular rate and rhythm.  No gallop.  ABDOMEN:  Soft.  Good bowel sounds.  No midline bruit.  No hepatomegaly.  EXTREMITIES:  No cyanosis, clubbing, or edema.  Pulses are intact.  NEURO:  Intact.  SKIN:  Unremarkable.   ASSESSMENT/PLAN:  I am delighted that Mr. Mcgregor has not had a  recurrence of his atrial fibrillation.  I have made no  change in his  medical program.  We will see him back in October.     Thomas C. Daleen Squibb, MD, [facility]  Electronically Signed    TCW/MedQ  DD: 02/11/2008  DT: 02/12/2008  Job #: [id number]

## 2011-01-17 NOTE — Consult Note (Signed)
[name], [name]              ACCOUNT NO.:  [id number]   MEDICAL RECORD NO.:  [id number]          PATIENT TYPE:  INP   LOCATION:  3743                         FACILITY:  [facility]   PHYSICIAN:  Doylene Canning. Ladona Ridgel, MD    DATE OF BIRTH:  01/24/1961   DATE OF CONSULTATION:  03/18/2007  DATE OF DISCHARGE:  03/19/2007                                 CONSULTATION   Consultation is requested by Dr. Juanito Doom   INDICATION FOR CONSULTATION:  Evaluation of atrial fibrillation and  consideration for medical therapy in a patient with ischemic heart  disease status post prior myocardial infarction.   The patient is 50 years old and was well until presenting approximately  2 years ago with acute anterior myocardial infarction.  At that time he  weighed over 300 pounds and was treated with primary angioplasty and  stenting.  He did quite well, began cardiac rehab and lost over 120  pounds.  He was maintained on medical therapy.  The patient was in his  usual state of health until experiencing palpitations back in January  and found to be in atrial fibrillation.  This was treated initially with  Coumadin and then he was DC cardioverted back in April, restoring sinus  rhythm.  He was seen back in June and was found to maintain sinus rhythm  but unfortunately soon after that went back to a-fib.  He has been  maintained on Coumadin and was admitted to the hospital several days ago  for initiation of amiodarone therapy.  He has been on 400 three times a  day.  He is referred today for evaluation for consideration for changing  him to Tikosyn therapy rather than continuing his amiodarone with its  potential side effects at his relatively young age.  The patient denies  history of syncope and denies congestive heart failure symptoms.  He  does feel weaker and more tired and has palpitations in atrial  fibrillation.  Otherwise, he had no specific complaints.   PAST MEDICAL HISTORY:  Notable for diabetes  which is now diet controlled  and essentially resolved since losing 120 pounds.  He has a history of  hypertension which is also essentially resolved after his significant  weight loss.  He has a history of his anterior myocardial infarction.  He has a history of morbid obesity, now also resolved.   FAMILY HISTORY:  Noncontributory.   SOCIAL HISTORY:  The patient denies tobacco or ethanol abuse.  He is  married.   REVIEW OF SYSTEMS:  Negative except as noted in the HPI.  Otherwise all  systems are reviewed and found to be negative.   PHYSICAL EXAMINATION:  He is a pleasant, well-appearing, middle-aged man  in no acute distress.  The blood pressure was 115/75.  The pulse was 80  and irregularly irregular.  Respirations were 18.  Temperature was 98.  HEENT:  Normocephalic and atraumatic.  Pupils equal and round.  Oropharynx was moist.  Sclerae anicteric.  There is no thyromegaly.  Trachea was midline.  Carotids were 2+ and symmetric.  LUNGS:  Clear bilaterally to auscultation.  There were no wheezes, rales  or rhonchi.  There was no increased work of breathing.  CARDIOVASCULAR:  Exam revealed an irregular rate and rhythm with normal  S1 and S2.  There were no murmurs, rubs or gallops appreciated.  The PMI  was not enlarged nor was it laterally displaced.  ABDOMEN:  Soft, nontender, nondistended.  There was no organomegaly.  Bowel sounds were present, and there was no rebound or guarding noted.  EXTREMITIES:  Demonstrated no cyanosis, clubbing or edema.  The pulses  were 2+ and symmetric.  There was no thyromegaly.  Trachea was midline.  NEUROLOGIC:  Alert and oriented x3.  Cranial nerves were intact.  Strength was 5/5 and symmetric.   The EKG initially back before he got on amiodarone demonstrated sinus  rhythm with a QT interval of 425 milliseconds.  Most recent EKG  demonstrates atrial fibrillation with a QT interval which is corrected  of 450 milliseconds.   IMPRESSION:  1.  Persistent atrial fibrillation, status post failed DC      cardioversion.  2. Coronary artery disease, status post anterior myocardial infarction      with near normalization of his LV function.  3. Diabetes, hypertension, morbid obesity.  All resolved with weight      loss.   DISCUSSION:  With regard to the treatment options, I think there are  several.  One option will be to continue his amiodarone.  The advantage  of this is that the risk of proarrhythmia is quite low, that he has  already been on it for 3 days and has tolerated it very nicely, and that  he has a good chance of maintaining sinus rhythm on this drug.  A second  option will be switching to sotalol therapy.  The downside is that the  interaction would be potentially difficult and would require to  discontinue his Carvedilol which he has tolerated very nicely.  Also the  efficacy of sotalol for maintaining sinus rhythm in a patient with prior  myocardial infarction and atrial fibrillation is questionable.  Finally,  Tikosyn will be an option.  Unfortunately because the patient has now  had over 3 grams of Tikosyn and has prolongation of his QT interval on  amiodarone, I think starting him on Tikosyn with amiodarone has a  potential for proarrhythmia which I would be quite concerned about.  For  this reason, I have recommended that we continue him on amiodarone  initially on 400 three times a day, decreasing his dose after one week  to 400 twice a day and then 400 daily thereafter.  I would recommend  that we proceed with DC cardioversion.  If there is any question would  stop his amiodarone perhaps after 6-9 months.  Hopefully by then he will  have had some reversed remodeling of his atria and allow him to maintain  sinus rhythm at least for a period of time off his amiodarone.  If  amiodarone was ultimately discontinued he could always start Tikosyn  after he had been allowed to washout of the amiodarone for 3-4  months.      Doylene Canning. Ladona Ridgel, MD  Electronically Signed     GWT/MEDQ  D:  03/18/2007  T:  03/19/2007  Job:  [id number]   cc:   Thomas C. Wall, MD, [facility]

## 2011-01-17 NOTE — Assessment & Plan Note (Signed)
[name]                            CARDIOLOGY OFFICE NOTE   NAME:[name], [name]                     MRN:          [id number]  DATE:05/21/2007                            DOB:          04-04-1961    [name] returns today for further management of paroxysmal atrial  fibrillation.   He has been on amiodarone now since the middle of July.  He has had no  recurrences clinically.  His dose is 200 mg p.o. daily.  His LFTs were  slightly up when we decreased the dose.  He has had LFTs repeated today  which are pending.   He has no complaints.  He is on simvastatin 20 mg a day, Coumadin as  directed, lisinopril 5 mg a day, aspirin 81 mg a day, amiodarone 200 mg  a day, Coreg CR 40 mg a day.   PHYSICAL EXAMINATION:  VITAL SIGNS:  Blood pressure 130/82, pulse 68 and  regular.  His EKG shows sinus rhythm with old anterior wall infarct.  His QTC is 457, slightly increased.  His weight is down 3 pounds to 189.  HEENT:  Unchanged.  NECK:  Carotid upstrokes are equal bilaterally without bruits.  No JVD.  Thyroid not enlarged.  Trachea midline.  LUNGS:  Clear.  HEART:  Regular rate and rhythm, no gallop.  ABDOMEN:  Soft.  There is no hepatomegaly or tenderness.  EXTREMITIES:  No edema.  Pulses are intact.  NEUROLOGIC:  Intact.   ASSESSMENT/PLAN:  Per Dr. Lubertha Basque and my discussion, we will  discontinue [name]'s amiodarone in the middle of October.  Hopefully, he will remain in sinus rhythm thereafter for a good while.  If not, we will consider additional antiarrhythmic therapy.  Will check  his LFTs today as well as his FT4 and TSH.  I made no changes in his  program.  I will see him back in December.     Thomas C. Daleen Squibb, MD, [facility]  Electronically Signed    TCW/MedQ  DD: 05/21/2007  DT: 05/21/2007  Job #: [id number]

## 2011-01-17 NOTE — Assessment & Plan Note (Signed)
[facility]                            CARDIOLOGY OFFICE NOTE   NAME:[name], [name]                     MRN:          [id number]  DATE:04/02/2007                            DOB:          12-01-1960    [name] comes in today for follow-up of his atrial fibrillation.  He has  been loaded with amiodarone from his hospitalization March 17, 2007.   He felt like he went back in normal rhythm this past week.  He indeed is  in normal rhythm with a normal EKG today.  His PR, QRS and QTC intervals  are normal.  His INR is being rechecked today and has been followed  closely since she started amiodarone.  He is on low dose simvastatin, so  drug interaction should not be an issue with that.  We will cut his  amiodarone back to 200 mg a day.   He apparently does not meet criteria for the DETERMINE study.  I am  trying to get the MRI results if they are indeed assessable.   MEDICATIONS:  1. Aspirin 81 mg a day.  2. Coreg CR 20 mg a day.  3. Amiodarone 200 mg p.o. b.i.d.  4. Multivitamin daily.  5. Lisinopril 5 mg a day.  6. Coumadin as directed.  7. Simvastatin 20 mg a day.   PHYSICAL EXAMINATION:  Blood pressure is 118/70, pulse 71 and regular,  weight 192.  The rest of his exam is unchanged.   PLAN:  1. Follow up on MRI scan if available for my review.  2. Decrease amiodarone to 200 mg a day.  3. Continue other medication.  4. Check Coumadin closely per Dr. Jimmey Ralph [facility].  5. Amiodarone labs today.  6. Follow up with me again in six weeks.     Thomas C. Daleen Squibb, MD, [facility]  Electronically Signed    TCW/MedQ  DD: 04/02/2007  DT: 04/03/2007  Job #: [id number]

## 2011-01-17 NOTE — H&P (Signed)
[name], [name]              ACCOUNT NO.:  [id number]   MEDICAL RECORD NO.:  [id number]          PATIENT TYPE:   LOCATION:                                 FACILITY:   PHYSICIAN:  Eduard Clos, MD     DATE OF BIRTH:   DATE OF ADMISSION:  DATE OF DISCHARGE:                              HISTORY & PHYSICAL   PRIORITY ADMISSION HISTORY AND PHYSICAL   CHIEF COMPLAINT:  Confusion.   HISTORY OF PRESENTING ILLNESS:  A 50 year old male with a known history  of MI status post stenting in 2006, hypertension, hyperlipidemia, was  brought into the ER after patient was found to have increasing  confusion.  The patient's wife stated that the patient called her at  work around 11 a.m. today saying that he was not feeling well and when  the patient's wife asked him did he drop the daughter at school, he  stated that he does not recall the whole incident.  The patient, in  fact, had dropped his daughter at school at 7 a.m.  The patient does not  recall the incidents, which happened yesterday and to the morning.  At  this time, patient is more alert, awake, and oriented.  He is able to  give his past medical history.  He recalls that he did go to [facility] on Tuesday, February 11, 2008, but his wife stated that he  was not able to recall what date was today, not able to tell what  happened yesterday and to the  morning.  Presently, patient still does  not recall what happened to the morning and past evening but does  remember everything that happened prior to this.  Patient has not had  any loss of consciousness but does feel uncomfortable, but denies any  chest pain, denies any shortness of breath, has no focal deficits,  denies any headache or visual symptoms, denies any fever or chills,  dysuria, discharges.   PAST MEDICAL HISTORY:  1. History of MI in 2006 with status post stenting.  2. Hypertension.  3. Hyperlipidemia.  4. Paroxysmal atrial fibrillation on  Coumadin.   PAST SURGICAL HISTORY:  1. Right eye laser surgery.   MEDICATIONS ON ADMISSION:  1. Lisinopril 5 mg daily.  2. Simvastatin 20 mg daily.  3. Coreg-CR 40 mg daily.  4. Coumadin 4 mg daily and 6 mg on Mondays and Fridays.  5. Aspirin 81 mg daily.  6. Fish oil 1000 mg daily.  7. Multivitamin p.o. daily.   ALLERGIES:  No known drug allergies.   FAMILY HISTORY:  Patient's mother has Alzheimer's dementia, which was  diagnosed at age 56.   SOCIAL HISTORY:  Patient denies smoking cigarettes, drinking alcohol, or  using any illegal drugs.   REVIEW OF SYSTEMS:  As per in the history of presenting illness, nothing  else significant.   PHYSICAL EXAMINATION:  Patient examined at the bedside and not in acute  distress.  VITAL SIGNS:  Blood pressure is 136/89, pulse 62 per minute, temperature  of 97.5, respiration 18 per minute, O2 sat 97%.  HEENT:  Anicteric, no pallor.  CHEST:  Bilateral air entry present, no rhonchi, and no crepitation.  HEART:  S1 and S2 heard.  ABDOMEN:  Soft and nontender, bowel sounds heard, no guarding or  rigidity.  CNS:  The patient is alert, awake, oriented to time, place, and person.  Both upper and lower extremities 5/5.  EXTREMITIES:  Peripheral pulses felt, there is no edema.   LABORATORY DATA:  Chest x-ray:  Nothing acute.  CAT scan of the head  without contrast:  No evidence of acute irreversible process.  Possible  old lacunar stroke, left basal ganglia.  CBC:  WBC 7.6, hemoglobin 15.8,  hematocrit 45.5, platelets 125, neutrophils 68.  PTT 26.  CMP:  Sodium  140, potassium 4.3, chloride 102, carbon dioxide 26, glucose 92, BUN 16,  creatinine 0.7.  Alkaline-phosphatase 53, AST 36, ALT 16, total protein  8.1, albumin 4.5, calcium 9.2.   ASSESSMENT:  1. Altered mental status, which is episodic.  2. Coronary artery disease status post stenting.  3. Hypertension.  4. Hyperlipidemia.  5. History of paroxysmal atrial fibrillation on  Coumadin.   PLAN:  Admit the patient to [facility].  Will place patient on neuro  checks.  Obtain MRA of the brain and carotid Doppler.  Will obtain RPR,  TSH, ammonia level, B12, and folate levels.  The patient may require a  neurological consult in the a.m.  Further recommendations as condition  evolves.      Eduard Clos, MD  Electronically Signed     ANK/MEDQ  D:  02/13/2008  T:  02/13/2008  Job:  [id number]

## 2011-01-17 NOTE — Assessment & Plan Note (Signed)
[facility]                            CARDIOLOGY OFFICE NOTE   NAME:[name], [name]                     MRN:          [id number]  DATE:11/12/2007                            DOB:          [birth year]    [name] returns today for close followup.   He has had no recurrent atrial fibrillation.  He is in sinus rhythm by  EKG.  He had been off amiodarone since October!  Hopefully his left  atrium has remodeled and he will not have this for a while.   His lipids had shown a significant change, though he was still on  simvastatin 20 mg a day back in January.  His total cholesterol was 111,  triglycerides were normal.  VLDL was normal.  His HDL dropped from 33 to  22.7 and his LDL increased from 51 to 72.  His LFTs were normal.   He is having no angina or ischemic symptoms.  Denies orthopnea, PND.  He  looks remarkably good today.   MEDICATIONS:  Unchanged since his last visit, except he added fish oil  1000 mg a day.   Blood pressure is 114/70, his pulse 60 and regular, weighs 183 down 2.  HEENT:  Normocephalic, atraumatic.  PERRLA.  Extraocular movements  intact.  Sclera clear.  Face symmetry normal.  Carotid upstrokes are equal bilaterally without bruits, no JVD.  Thyroid  is not enlarged.  Trachea is midline.  Lungs were clear.  HEART:  Reveals a nondisplaced PMI.  No S3 gallop.  ABDOMEN:  Soft, good bowel sounds.  No midline bruit or hepatomegaly.  EXTREMITIES:  No cyanosis, clubbing or edema.  Pulses intact.  SKIN:  Unremarkable.   ASSESSMENT/PLAN:  [name] is doing remarkably well.  I am delighted he  has not had a recurrence of his atrial fib.  He looks confident and he  is compliant as always.   I will see him back in 3 months.     Thomas C. Daleen Squibb, MD, [facility]  Electronically Signed    TCW/MedQ  DD: 11/12/2007  DT: 11/12/2007  Job #: [id number]

## 2011-01-17 NOTE — Op Note (Signed)
[name], [name]              ACCOUNT NO.:  [id number]   MEDICAL RECORD NO.:  [id number]          PATIENT TYPE:  INP   LOCATION:  2917                         FACILITY:  [facility]   PHYSICIAN:  Hillis Range, MD       DATE OF BIRTH:  [birth year]   DATE OF PROCEDURE:  DATE OF DISCHARGE:                               OPERATIVE REPORT   PREPROCEDURE DIAGNOSIS:  Persistent atrial fibrillation.   POSTPROCEDURE DIAGNOSIS:  Persistent atrial fibrillation.   PROCEDURES:  1. Comprehensive electrophysiology study.  2. Coronary sinus pacing and recording.  3. Three-dimensional mapping of supraventricular tachycardia.  4. Arterial blood pressure monitoring.  5. Intracardiac echocardiography.  6. Transseptal puncture of an intact septum.  7. Pulmonary vein venography.  8. Ablation of supraventricular tachycardia.  9. Arrhythmia induction with pacing following ablation.   INTRODUCTION:  [name] is a pleasant 50 year old gentleman with a  history of persistent atrial fibrillation, who presents today for EP  study and radiofrequency ablation.  The patient has had a significant  weight loss despite a history of morbid obesity.  He continues to have  atrial fibrillation which has been refractory to medical therapy  including metoprolol and amiodarone.  He therefore presents today for EP  study and radiofrequency ablation.   DESCRIPTION OF PROCEDURE:  Informed written consent was obtained and the  patient was brought to the Electrophysiology Lab in a fasting state.  He  was adequately sedated with intravenous medications as outlined in the  anesthesia report.  The patient's right and left groins were prepped and  draped in the usual sterile fashion by EP Lab staff.  Using a  percutaneous Seldinger technique, one 6, one 7, and one 8-French  hemostasis sheaths were placed in the right common femoral vein.  An 20-  [place] hemostasis sheath was placed in the left common femoral vein.  A  4-French hemostasis sheath was placed in the right common femoral artery  for blood pressure monitoring.  A 6-French decapolar Polaris X catheter  was introduced into the right common femoral vein and advanced into the  coronary sinus for recording and pacing from this location.  The patient  was noted to have a very anterior displacement of the coronary sinus  ostium.  A 6-French quadripolar Josephson catheter was introduced  through the right common femoral vein and advanced into the right  ventricle for recording and pacing.  This catheter was then pulled back  to the His bundle location.  The patient presented to the  Electrophysiology Lab in atrial fibrillation.  His average RR interval  was 740 milliseconds.  His QRS duration was 92 milliseconds.  His HV  interval was 41 milliseconds.  A 10-French Biosense Webster SoundStar  intracardiac echocardiography catheter was introduced through the left  common femoral vein and advanced into the right atrium.  Intracardiac  echocardiography of the left atrium was performed which revealed a  minimally enlarged left atrium with 4 large pulmonary veins, each with  separate ostia.  The middle right common femoral vein sheath was  exchanged for an 8.5-French SL2 transseptal sheath and transseptal  access was achieved in a standard fashion using a Brockenbrough needle  under biplane fluoroscopy with intracardiac echocardiography  confirmation of the transseptal puncture.  Once transseptal access had  been achieved, heparin was administered intravenously and  intraarterially in order to achieve a target ACT of greater than 350  seconds.  A 6-French multipurpose angiographic catheter with guidewire  was introduced through the transseptal sheath and positioned over the  mouth of all 4 pulmonary veins.  Pulmonary venograms were performed by  hand injection of nonionic contrast and demonstrated very large  pulmonary veins with no evidence of pulmonary  vein stenosis.  Each  pulmonary vein measured between 24 and 29 mm in size.  The angiographic  catheter was then removed.  The His bundle catheter was removed and in  its place a 3.5 mm Biosense Webster EZ Kilbourne ThermoCool ablation  catheter was advanced into the right atrium.  The transseptal sheath was  pulled back into the IVC over a guidewire.  The ablation catheter was  advanced across the transseptal hole using the wire as a guide.  The  transseptal sheath was then re-advanced over the guidewire into the left  atrium.  A 25-mm 20-pole circular mapping catheter was introduced  through the transseptal sheath and positioned over the mouth of all 4  pulmonary veins.  Three-dimensional electrode anatomical mapping was  performed using the [employer].  The patient was noted to have  electrical activity within all 4 pulmonary veins at baseline.  The  patient underwent successful sequential anatomical encircling of all 4  pulmonary veins using radiofrequency current with complete abolishment  of atrial electrograms around the mouth of the pulmonary veins.  Complex  fractionated atrial electrograms were then identified and ablated along  the roof of the left atrium, the interatrial septum, and the mitral  valve annulus above the coronary sinus.  Additional complex fractionated  atrial electrograms were identified at the base of the left atrial  appendage and successfully ablated in this location.  The patient was  then successfully cardioverted to sinus rhythm with a single-  synchronized 360-joule biphasic shock with cardioversion electrodes in  the anterior-posterior thoracic configuration.  He remained in sinus  rhythm for only several seconds and was noted to have very frequent and  incessant premature atrial contractions.  Before the PACs could be  mapped, the patient returned to atrial fibrillation.  The PACs, however,  were briefly mapped and were felt to arise from the base of  the left  atrial appendage.  Additional ablation of complex fractionated atrial  electrograms were, therefore, ablated at the base of the left atrial  appendage and near the junction of the left superior pulmonary vein.  Additional complex fractionated atrial electrograms were also ablated  along the interatrial septum.  Bidirectional block from the pulmonary  veins could not be demonstrated as the patient had persistence of atrial  fibrillation.  Finally following ablation of these cafe au lait spots,  the patient was again successfully cardioverted to sinus rhythm with a  single-synchronized 200-joule biphasic shock.  He remained in sinus  rhythm with no PACs thereafter.  Following ablation, his AH interval  measured 78 milliseconds with an HV interval of 50 milliseconds.  Ventricular pacing was performed which revealed midline decremental VA  conduction with a VA Wenckebach cycle length of 530 milliseconds.  Rapid  atrial pacing was performed which revealed no inducible atrial  arrhythmias with an AV Wenckebach cycle length of 360 milliseconds.  Intracardiac  echocardiography was again performed which revealed no  pericardial effusion.  The procedure was, therefore, considered  completed.  All catheters were removed and the sheaths were aspirated  and flushed.  All sheaths were removed and hemostasis was assured.  There were no early apparent complications.  A limited bedside  transthoracic echocardiogram revealed no pericardial effusion.   CONCLUSIONS:  1. Persistent atrial fibrillation upon presentation.  2. Successful anatomical encircling of all 4 pulmonary veins using      radiofrequency current.  3. Successful ablation of complex fractionated atrial electrograms      along the roof of the left atrium, the interatrial septum, at the      base of the left atrial appendage and at the junction with the left      superior pulmonary vein, and      above the coronary sinus along the  mitral valve annulus.  4. Successful cardioversion to sinus rhythm.  5. No early apparent complications.      Hillis Range, MD  Electronically Signed     JA/MEDQ  D:  02/08/2009  T:  02/09/2009  Job:  [id number]   cc:   Thomas C. Wall, MD, [facility]

## 2011-01-17 NOTE — Assessment & Plan Note (Signed)
[facility]                                 ON-CALL NOTE   NAME:[name], [name]                     MRN:          [id number]  DATE:01/13/2010                            DOB:          1960-10-23    I was contacted this evening by [name].  He feels that he has  reverted back to atrial fibrillation.  The patient explains to me that  he has a history of paroxysmal atrial fibrillation and has undergone  ablation in June 2010.  He also reports that he has had subsequent  episodes where he has had paroxysms of atrial fibrillation which has  required DC cardioversion.  This evening after eating supper he feels  that his heart has flipped out of rhythm back into what he believes is  atrial fibrillation.  He has taken his heart rate and reports that is in  the 80s.  He is not having any active cardiac symptoms.  Specifically,  he denies any chest pain, shortness of breath, presyncope or syncope.  I  explained to the patient that since he was not having any tachycardia  and he was fairly asymptomatic that it would be safe for him to contact  the office in the morning to discuss further treatment options which may  include initiation of an antiarrhythmic drug or potentially DC  cardioversion.  It should be noted that the patient is on chronic  anticoagulation but he reports that his INR was subtherapeutic when it  was checked yesterday with a level of 1.5.  The patient will call the  office in the morning and he was agreeable to this plan.  I also  instructed him that should he develop active cardiac symptoms or  tachycardia with ventricular rates greater than 110 beats per minute he  should call us back and potentially come to the emergency department.  The patient voiced understanding and was in agreement with this plan.     Therisa Doyne, MD    SJT/MedQ  DD: 01/13/2010  DT: 01/13/2010  Job #: [id number]

## 2011-01-17 NOTE — Assessment & Plan Note (Signed)
[facility]                            CARDIOLOGY OFFICE NOTE   NAME:[name], [name]                     MRN:          [id number]  DATE:09/06/2007                            DOB:          March 22, 1961    [name] comes in today because of some chest discomfort.   On New Year's Eve, and watching the ball drop, he laid down on his left  side.  He had an aching in his left side.  This happened twice during  the night when he laid on his left side.  He did take nitroglycerin one  time for each event which relieved the discomfort within about 3-4  minutes.  He denied any shortness of breath, nausea, any substernal  chest discomfort like his previous MI.  He had no discomfort in his arm.   He has been up and about for the last day or so not having any  exertional chest discomfort.   He went to his primary care yesterday who called Dr. Eden Emms on-call.  Dr. Eden Emms loaded him with Plavix in anticipation that he might need an  intervention.   His current medications are:  1. Simvastatin 20 mg a day.  2. Coumadin as directed.  3. Lisinopril 5 mg a day.  4. Multivitamin daily.  5. Aspirin 81 mg a day.  6. Coreg CR 40 mg a day.   He looks remarkably good today.  His blood pressure is 118/72, his pulse  is 66 and regular.  His EKG is normal with no ST-segment changes.  There  is slight poor R wave progression in the anterior precordium, otherwise  negative.  This is stable.  His weight is 185.  HEENT:  Normocephalic,  atraumatic.  PERRL.  Extraocular is intact.  Sclerae are clear.  Facial  symmetry is normal.  Carotids upstrokes are equal bilateral without  bruits, no JVD.  Thyroid is not enlarged.  Trachea is midline.  LUNGS:  Clear.  There is no rub and I listened extensively in the  anterior and posterolateral distribution of the left lung.  He has got  good breath sounds throughout.  HEART:  Reveals a nondisplaced PMI.  Normal S1, S2 without gallop.  There is no rub.  There is no click.  There is no murmur, ABDOMEN:  Soft, good bowel sounds.  No midline bruit.  No hepatomegaly.  EXTREMITIES:  Reveal no cyanosis, clubbing or edema.  Pulses are intact.  NEURO:  Exam is intact.   Noncardiac chest pain.   He is due a stress Myoview in February.  We will move that up for next  week.  If this is negative for ischemia I will see him back in March as  scheduled.   Fortunately, he has not had any recurrence of his atrial fibrillation.     Thomas C. Daleen Squibb, MD, [facility]  Electronically Signed    TCW/MedQ  DD: 09/06/2007  DT: 09/06/2007  Job #: [id number]

## 2011-01-17 NOTE — H&P (Signed)
NAMEGILMORE, LIST              ACCOUNT NO.:  [id number]   MEDICAL RECORD NO.:  [id number]          PATIENT TYPE:  INP   LOCATION:  3743                         FACILITY:  [facility]   PHYSICIAN:  Thomas C. Wall, MD, FACCDATE OF BIRTH:  01/16/1961   DATE OF ADMISSION:  03/16/2007  DATE OF DISCHARGE:                              HISTORY & PHYSICAL   CHIEF COMPLAINT:  My heart is out of rhythm again.   HISTORY OF PRESENT ILLNESS:  [name] is a 50 year old  gentleman who presents to the office today with recurrent atrial  fibrillation.   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  He underwent a  drug-eluting stent at that time to a proximal mid LAD.  He had full  recovery of his LV function, with his last EF being 55% by 2D  echocardiogram on March 06, 2007.   He has a history of mild to moderate mitral regurgitation.  His last  echo showed this only to be trivial.  He has some mild left atrial  enlargement.   He had the onset of atrial fibrillation earlier this year.  He was put  on Coumadin.  He underwent DC cardioversion on January 01, 2007.  He was  successfully cardioverted with 100 joules.   I saw him back in the office on March 06, 2007, at which time he was in  sinus rhythm.  Reviewed the good results of his echo.  We stopped his  Coumadin and began him on aspirin.  Unfortunately, he went into atrial  fib within the next day.  He apparently is back on his Coumadin now.   He saw Dr. Excell Seltzer in the office, who started Cardizem CD 120 daily.   He is presently only having some palpitations.  He is very anxious, as  usual.   CURRENT MEDICATIONS:  1. Simvastatin 20 mg a day.  2. Coreg CR 40 mg a day.  3. Coumadin as directed.  4. Lisinopril 5 mg a day.  5. Cardizem CD 120 daily.   ALLERGIES:  No known drug allergies.   PAST MEDICAL HISTORY:  He has a history of diabetes mellitus, but  lost  over 100 pounds after his infarct and now it is resolved and cured!  He  has a history of hyperlipidemia on simvastatin.  He has a history of  hypertension, which is under good control.  He also has a history of  bipolar disorder.  He is extremely anxious most of the time.   SOCIAL HISTORY:  He is married.  He lives with his wife and his family.  He lives in [place].  He does not smoke.  He does not drink.  He is  extremely particular with his diet.   REVIEW OF SYSTEMS:  Negative, other than the HPI.   EXAM:  He is very pleasant.  His blood pressure is 110/70, his pulse is  73 and regular, his weight is 190.  HEENT:  Normocephalic, atraumatic.  PERRLA.  Extraocular movements are intact.  He wears glasses.  Facial  symmetry is normal.  Carotid upstrokes are equal bilaterally without  bruits.  There is no JVD.  Thyroid is not enlarged.  Trachea is midline.  Lungs are clear.  Heart reveals a nondisplaced PMI.  He has no murmur.  He has an irregular rate and rhythm that is well controlled.  Abdominal  exam is soft, good bowel sounds, no midline bruit, no hepatomegaly.  Extremities:  No cyanosis or clubbing or edema.  Pulses are intact.  Neurologic exam is intact.  Skin is unremarkable.   His electrocardiogram shows atrial fib.   ASSESSMENT/PLAN:  1. Recurrent atrial fibrillation after recent cardioversion.  2. Anticoagulation.  3. Coronary artery disease, status post anterior wall infarct with      nearly full recovery of his LV function.  4. Mild mitral regurgitation.   I had a long talk with Mr. Siever and his wife.  I think he needs to be  admitted to the hospital for amiodarone initiation.  With his Coreg, he  is not a candidate for sotalol and his coronary disease, he is clearly  not a candidate for a type 1C agent.   He agrees to be admitted for careful monitoring.  He will probably need  3-5 days in the hospital with a load.  He will then need a subsequent  elective  cardioversion if he does not convert on his own.      Thomas C. Daleen Squibb, MD, [facility]  Electronically Signed     TCW/MEDQ  D:  03/15/2007  T:  03/15/2007  Job:  [id number]   cc:   Dr. Tresa Endo

## 2011-01-17 NOTE — Assessment & Plan Note (Signed)
 HEALTHCARE                            CARDIOLOGY OFFICE NOTE   NAME:[name], [name]                     MRN:          [id number]  DATE:03/06/2007                            DOB:          November 13, 1960    [name] returns today for further management of his paroxysmal atrial  fibrillation and other problems as outlined in the chart.   Fortunately, he is holding sinus rhythm.  His EKG today shows sinus  rhythm at a rate of 63 beats per minute.  We repeated his 2D echo, which  shows only mild mitral regurgitation.  This is an improvement.  His LV  function is normal with an EF of 55%.  He has a mild left atrial  enlargement.   He is begging me to take him off Coumadin.  His [place] 2 score is really 1  or less.  I think it is reasonable to stop that and move his aspirin to  325 a day.   His other medications are:  1. Coreg CR 40 mg a day.  2. Lisinopril 5 mg a day.  3. Aspirin 81 mg a day.  4. Multivitamin daily.  5. He is on Coumadin at the present time.   His blood pressure is 126/78.  His pulse is 61 and regular.  Weight is  184 and stable.  HEENT:  Normocephalic and atraumatic.  PERRLA.  Extraocular movements  intact.  Sclerae are clear.  Facial symmetry is normal.  LUNGS:  Clear.  HEART:  Reveals a non-displaced PMI.  He has a normal S1 and S2 without  murmur, rub, or gallop.  ABDOMINAL EXAM:  Soft with good bowel sounds.  No midline bruit.  EXTREMITIES:  Reveal no edema.  Pulses are intact.   I am delighted with how Mr. Belmar is doing.  We stopped his Coumadin,  and began him on aspirin 325 a day.  I will see him back before the  Christmas holidays.     Thomas C. Daleen Squibb, MD, [facility]  Electronically Signed    TCW/MedQ  DD: 03/06/2007  DT: 03/07/2007  Job #: [id number]   cc:   Cassie Freer, M.D.

## 2011-01-17 NOTE — Discharge Summary (Signed)
NAMEWIN, GUAJARDO              ACCOUNT NO.:  [id number]   MEDICAL RECORD NO.:  [id number]          PATIENT TYPE:  INP   LOCATION:  2917                         FACILITY:  [facility]   PHYSICIAN:  Hillis Range, MD       DATE OF BIRTH:  1961/05/28   DATE OF ADMISSION:  02/08/2009  DATE OF DISCHARGE:  02/09/2009                               DISCHARGE SUMMARY   This patient has no known drug allergies.  Time for this dictation,  examination, and explanation to the patient greater than 45 minutes.   FINAL DIAGNOSES:  1. Persistent atrial fibrillation.   SECONDARY DIAGNOSES:  1. History of anterior wall myocardial infarction, August 2006 with      drug-eluting stent placed in the proximal left anterior descending.  2. Recatheterization, September 2006 with finding of jailed diagonal      which was occluded and collateralized without responsible for any      anginal or non-perfused area.  3. Prior obesity, with significant weight loss (120 lbs)  4. Cerebrovascular disease.  5. Type 2 diabetes.  6. Hyperlipidemia.  7. Hypertension.  8. Obstructive sleep apnea, improved with weight loss.   PROCEDURE:  September 10, 2008, as mentioned above, electrophysiology study  with anatomic encirclement of all 4 pulmonary veins with no inducible  arrhythmia after the procedure, Dr. Hillis Range   BRIEF HISTORY:  Mr. Chason is a 50 year old male.  He has a history of  coronary artery disease, hypertension, and diabetes.  He also developed  atrial fibrillation after his myocardial infarction.  He presents for  electrophysiology consult regarding his atrial fibrillation.  The  patient has had a cardioversion on May 2008.  He maintained sinus rhythm  for about 2 months.  The patient has had amiodarone antiarrhythmic  therapy for 73-month period from July to October 2008.  He maintained  sinus rhythm until April 17 of this year.   The patient has been in atrial fibrillation since April.  His symptoms  are fatigue, heart racing, and decreased exercise capacity.  He does not  experience chest pain, shortness of breath, dizziness, presyncope, or  syncope.  He notes that his heart has been very elevated with exercise  and he has been afraid to continue his previous exercise regimen.  He  has been treated with Coumadin since January 2008.  He has had no  problems with bleeding.   During this consultation, the medical and therapeutic strategies  including atrial fibrillation and ablation were discussed with the  patient.  He wishes to proceed.  The risks and benefits have been made  known to him.   HOSPITAL COURSE:  The patient presents on June 7.  He underwent  electrophysiology study with ablation of atrial fibrillation.  There was  no recurrence despite challenge in the electrophysiology lab.  The  patient discharging postprocedure day #1.  He did have a period of mild  2/10 chest discomfort which he said seemed to him more a result of his  transesophageal echocardiogram done prior to the EP study that resolved  with an oxycodone.  Follow up echocardiogram  was not deemed necessary.  He was not have any shortness of breath.  He has not had any recurrence  of strict chest pain radiating to the back.  Of note, transesophageal  echocardiogram done on June 7 showed an ejection fraction of 60%.  No  left atrial appendage thrombus, small PFO with bubbles only crossing  with Valsalva.  He then underwent electrophysiology study and  radiofrequency catheter ablation the same day.  At the time of  discharge, his medications are:  1. Amiodarone 200 mg daily.  2. Coumadin 6 mg on Tuesday, June 8; then 4 mg Wednesday, June 9; and      then the [facility] Clinic, June 10.  3. Simvastatin 20 mg daily at bedtime.  4. Lisinopril 5 mg.  He is to hold that today and on Wednesday and to      restart on Thursday, June 10.  5. Multivitamin daily.  6. Enteric-coated aspirin 81 mg daily.  7. Coreg CR 40 mg 1  tablet daily.  8. Fish oil caps 2 tablets daily.  9. Vitamin B6, 100 mg 1 tablet daily.  10.Vitamin B12, 100 mcg 1 tablet daily.  11.Nitroglycerin 0.4 mg 1 tablet under the tongue every 5 minutes x3      doses for chest pain.  12.New, Prilosec 20 mg daily for next 6 weeks.   He follows up with [facility], Coumadin Clinic, Thursday, June  10 at 10:45.  He will see Dr. [name] on October 2010.  Dr. Jenel Lucks  office will call with an appointment.   LABORATORY STUDIES:  Pertinent to this admission were drawn on June 2,  sodium 145, potassium 5.2, chloride, 112, carbonate 33, glucose 92, BUN  is 13, and creatinine 0.9.  White cells 4.4, hemoglobin 15.5, hematocrit  44.9, and platelets are 114.  INR on admission, June 7, was 1.5.      Maple Mirza, [place]      Hillis Range, MD  Electronically Signed    GM/MEDQ  D:  02/09/2009  T:  02/10/2009  Job:  [id number]

## 2011-01-17 NOTE — Assessment & Plan Note (Signed)
[facility]                            CARDIOLOGY OFFICE NOTE   NAME:[name]                     MRN:          [id number]  DATE:09/09/2008                            DOB:          05-02-1961    [name] comes in today for followup.   Please see his problem list from February 11, 2008.   He offers no complaints today.  He specifics has not had any clinical  atrial fib and now has been off amiodarone since October 2008.  He has  also had no angina or ischemic symptoms.  He remains very active.   When he had his visit to the hospital for a transient global amnesia  event in June, he had blood work.  This was remarkable for normal CBC,  normal comprehensive metabolic panel, a normal TSH, total cholesterol of  90, triglycerides of 33, HDL 32, LDL 51, total cholesterol to HDL ratio  of 2.8, normal W09, and normal folate as well as a C-reactive protein of  0.   His meds are unchanged since last visit except now he is taking 2 g of  fish oil a day.   PHYSICAL EXAMINATION:  VITAL SIGNS:  His blood pressure 124/70, pulse is  60 and regular.  Weight is 186, up 5.  EKG confirms sinus rhythm with a  previous anterior septal wall infarct.  This is stable.  HEENT:  Face looks fuller with the weight gain which is a good thing.  Rest of his HEENT is unchanged and is unremarkable.  NECK:  Supple.  Carotid upstrokes are equal bilaterally without bruits.  Thyroid is not enlarged.  Trachea is midline.  LUNGS:  Clear to auscultation and percussion.  HEART:  A nondisplaced PMI.  Normal S1 and S2.  No gallop.  No murmur.  ABDOMEN:  Soft.  Good bowel sounds.  No midline bruit.  No hepatomegaly.  EXTREMITIES:  No cyanosis, clubbing, or edema.  Pulses are intact.   ASSESSMENT AND PLAN:  [name] is doing remarkably well.  He is not  due any objective studies at this time.  We will have him return in 3  months.  His next stress Myoview will be due in January 2011.   His next  echo we will repeat in the summer of 2010.  Blood work will need  repeated in the summer of 2010.     John C. Daleen Squibb, MD, [facility]  Electronically Signed    TCW/MedQ  DD: 09/09/2008  DT: 09/09/2008  Job #: [id number]

## 2011-01-17 NOTE — Consult Note (Signed)
[name], PRIEBE              ACCOUNT NO.:  [id number]   MEDICAL RECORD NO.:  [id number]          PATIENT TYPE:  INP   LOCATION:  3728                         FACILITY:  [facility]   PHYSICIAN:  [name], M.D.  DATE OF BIRTH:  May 23, 1961   DATE OF CONSULTATION:  02/15/2008  DATE OF DISCHARGE:  02/15/2008                                 CONSULTATION    This pleasant 50 year old, fully alert and oriented, right-handed  married male presented on Thursday with a confusional episode noticed by  his wife.  She states that he is normally very exacting and very much  oriented.  She states that she relies on his memory and was very  concerned when he suddenly insisted.  It was Wednesday.  Also, she was  sure that it was Thursday.  He had also forgotten specific events and  occasions that had taken place on the previous day.  She brought him to  the hospital and he was admitted under the assumption that he suffered  from a transient global amnesia.  He repetitively asked the same  questions in the ER and normal blood pressure, blood glucose, and a  negative tox screen were established.  A CT scan was also unremarkable.  An MRI was ordered.   REVIEW OF SYSTEMS:  Negative for pain, fever, dizziness, recent fall,  trauma, nausea or diarrhea, rash, bruising, or falls.   SOCIAL HISTORY:  The patient is married.  He is a nonsmoker and  nondrinker.  His surviving parent is his mother who is 71 years old and  lives in a skilled nursing facility at age 53 with advanced dementia.  He states that her condition has been a big stressor for him and his  wife primarily because her care has been accelerated, but also because  he is concerned that he might himself be prone to develop dementia as he  has already been concerned with that question.  He also wondered if the  transient global amnesia may be a precursor for dementia.   PAST MEDICAL HISTORY:  Atrial fibrillation and hypertension.  He used  to  be markedly obese and 300 pounds excess and have lost his weight by  dieting and control it over 4 years.  Coronary artery disease and a  myocardial infarction followed by [facility] 4 years ago led to his lifestyle  changes.  He is still hyperlipidemic and controlled on Zocor.  He  developed diabetes mellitus type 2 while he was obese and is now diet  controlled only.   CURRENT MEDICATIONS:  Baby aspirin, Coumadin 2.5 mg alternating with 5,  and Coreg.  He takes DHA and omega fish oil and ACE inhibitor, Zocor,  Plavix and a beta-blocker, not further named here.   PHYSICAL EXAMINATION:  VITAL SIGNS:  Blood pressure 128/80, respiratory  rate of 18, and a pulse rate of 80 and regular.  LUNGS:  Clear to auscultation.  CARDIAC:  There is no cardiac murmur.  No carotid bruits.  EXTREMITIES:  There is no peripheral clubbing, cyanosis, or edema noted.  MENTAL STATUS:  Fully intact.  The patient  cannot carry a fluent  conversation, is in no acute distress.  He is asking the questions and  he answers fluently to any questions posed to him.  There is no focal  cranial nerve deficit.  No motor deficit or sensory deficit, and he has  reflexes that are symmetric.  Gait is not impaired as he guides me into  the exam room and out when I voiced my orders.   Review of the medical records shows that an MRI was obtained which shows  no fresh lesions on a diffusion-weighted image of the brain multiple,  but multiple heavy vascular extensive spaces that I believe have to deal  with the patient's previous history of morbid obesity and hypertension.  There are old periventricular white matter lesions, but were probably  there before he started his weight loss.   ASSESSMENT:  Transient global amnesia.  I did initiate a vasculitic  workup.  If anything of this will returned positive, I would ask his  primary care physician.  I talked to Dr. Valera Castle cardiology to  follow up with him.  An EEG was discussed  and found that 2 days after  the event is not likely to show any changes and therefore deferred.  Should the patient suffer a second transient global amnesia, however, I  will opt to perform an EEG.  I thank Dr. Sharon Seller for this interesting  consultation.   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      [name], M.D.  Electronically Signed     CD/MEDQ  D:  02/15/2008  T:  02/15/2008  Job:  [id number]   cc:   Lonia Blood, M.D.  Thomas C. Wall, MD, [facility]

## 2011-01-17 NOTE — Consult Note (Signed)
[name], [name]              ACCOUNT NO.:  [id number]   MEDICAL RECORD NO.:  [id number]          PATIENT TYPE:  OUT   LOCATION:  DFTL                         FACILITY:  [facility]   PHYSICIAN:  Noralyn Pick. Eden Emms, MD, FACCDATE OF BIRTH:  1961-04-30   DATE OF CONSULTATION:  DATE OF DISCHARGE:  03/18/2007                                 CONSULTATION   I received a call from Dr. Kathrynn Running at Urgent Care.  Mr. Chura was seen  at the Urgent Care today for left axillary pain.  The patient has a  history of coronary artery disease.   Apparently he has had previous stents.  He was taking aspirin and  statins but not on Plavix.  The patient's pain was atypical.  It was  resting.  It was worse on his left side; however, there was some relief  with nitro.  He had no EKG changes, was pain free, and markers were  negative.  Dr. Kathrynn Running felt that the patient could be followed up as an  outpatient.  I tended to agree.  We did make sure the patient knew to go  directly to [facility] ER rather than Urgent Care for recurrent symptoms.  I  told Dr. Kathrynn Running to give the patient 150 of Plavix and have take 150  tomorrow.  I left a message at the office to have the patient seen  tomorrow in the office for further workup.  Again, as far as I could  tell from our phone conversation, the patient was pain free with  somewhat atypical nonexertional pain with no EKG changes and negative  markers.  There was some question of abnormal LFTs in the past with  Plavix but I think a short course until he is evaluated will be fine.      Noralyn Pick. Eden Emms, MD, [facility]  Electronically Signed     PCN/MEDQ  D:  09/05/2007  T:  09/05/2007  Job:  [id number]

## 2011-01-17 NOTE — Op Note (Signed)
NAMEREMIJIO, HOLLERAN              ACCOUNT NO.:  [id number]   MEDICAL RECORD NO.:  [id number]          PATIENT TYPE:  OIB   LOCATION:  2861                         FACILITY:  [facility]   PHYSICIAN:  Jesse Sans. Wall, MD, FACCDATE OF BIRTH:  1961-03-14   DATE OF PROCEDURE:  DATE OF DISCHARGE:                               OPERATIVE REPORT   OUTPATIENT CARDIOVERSION PROCEDURE:   TIME:  1400.   INDICATION:  Paroxysmal atrial fibrillation.   Informed consent was obtained.  Anesthesia was provided by Dr. Judie Petit, 225 mg of Pentothal was administered.  One biphasic  synchronized shock was delivered with 100 jewels, converting him to  normal sinus rhythm.  There were no complications.  The patient is  currently awake.   There has been no changes in his medications.  His INR was therapeutic  at 2.5.  We will follow him up in the office in 2 weeks.      Thomas C. Daleen Squibb, MD, [facility]  Electronically Signed     TCW/MEDQ  D:  01/01/2007  T:  01/01/2007  Job:  [id number]   cc:   Nicki Guadalajara, M.D.

## 2011-01-17 NOTE — Assessment & Plan Note (Signed)
[facility]                            CARDIOLOGY OFFICE NOTE   NAME:Escoe, Anselmo T                     MRN:          [id number]  DATE:01/11/2007                            DOB:          1960/11/20    Mr. Muhlenkamp returns today after being DC cardioverted successfully back  to sinus rhythm on January 01, 2007.  Please refer to that note.   He feels like he is still in normal rhythm.  His pressure and his pulse  have been steady.   He offers no complaints today.   MEDICATIONS:  1. He is on aspirin 325 a day.  2. Simvastatin 20 mg a day.  3. Plavix 75 mg a day.  4. Coreg CR 40 mg a day.  5. Coumadin as directed with a Pro Time to be checked today.  6. Lisinopril 5 mg a day.   He looks remarkably good.  HEENT:  Normocephalic, atraumatic.  PERRLA.  Extraocular movements are  intact.  Sclerae clear.  Facial symmetry is normal.  He is wearing  glasses.  Carotid upstrokes are equal bilaterally without bruits.  There  is no JVD.  Thyroid is not enlarged.  LUNGS:  Clear.  HEART:  Regular rate and rhythm.  ABDOMEN:  Soft.  EXTREMITIES:  With no edema.  Pulses are intact.   ASSESSMENT AND PLAN:  I am delighted that Mr. Varelas is holding sinus  rhythm.  After much discussion and reassurance, we are going to stop his  Plavix.  He is well over a year-and-a-half since his stent.  Will  schedule followup the end of June.  At that time, we will do a 2D  echocardiogram.  If he is maintaining sinus rhythm and his clinical  situation is stable, we will probably stop his Coumadin.  His atrial  fibrillation is symptomatic.     Thomas C. Daleen Squibb, MD, [facility]  Electronically Signed    TCW/MedQ  DD: 01/11/2007  DT: 01/11/2007  Job #: [id number]   cc:   Dr. Olivia Canter

## 2011-01-17 NOTE — Discharge Summary (Signed)
[name], [name]              ACCOUNT NO.:  [id number]   MEDICAL RECORD NO.:  [id number]          PATIENT TYPE:  INP   LOCATION:  3743                         FACILITY:  [facility]   PHYSICIAN:  Thomas C. Wall, MD, FACCDATE OF BIRTH:  11-Jun-1961   DATE OF ADMISSION:  03/16/2007  DATE OF DISCHARGE:  03/19/2007                         DISCHARGE SUMMARY - REFERRING   DISCHARGE DIAGNOSES:  1. Paroxysmal atrial fibrillation.  2. Amiodarone initiation.  3. Bradycardia with subsequent decrease in Coreg.  4. Subtherapeutic INR at the time of discharge.  5. History as noted below.   HISTORY OF PRESENT ILLNESS:  [name] is a 50 year old male who  presents to the office with recurrent atrial fibrillation.  Apparently  on March 06, 2007 while being seen in the office, he was noted to be in  sinus rhythm.  Thus, his Coumadin was discontinued, and he was placed on  aspirin; however, he went back into atrial fibrillation the following  day.  He was replaced on Coumadin, and Cardizem was started.  He has  excessive anxiety associated with his palpitations.   PAST MEDICAL HISTORY:  1. Anterior myocardial infarction in August of 2006 with drug-eluting      stent to the proximal mid-LAD.  LV function fully recovered, and an      EF by echocardiogram on March 06, 2007 was 55%.  2. History is also notable for diabetes which has been controlled with      weight loss.  3. Hyperlipidemia.  4. Hypertension.  5. Bipolar disorder.   LABORATORY DATA:  H&H 15.5 and 45.2, normal indices, platelets 157, WBCs  6.9.  Admission PT was 26.7, INR 2.3.  At the time of discharge PT was  20.9, INR 1.7.  Admission sodium was 138, potassium 4.6, BUN 19,  creatinine 0.88, glucose 86.  Normal LFTs.  TSH was 1.148.  Prior to  discharge, sodium was 138, potassium 3.9, BUN 18, creatinine 0.92,  glucose 92.  At the time of admission, telemetry showed atrial  fibrillation with a ventricular rate of 55.  Throughout his  admission he  remained in atrial fibrillation with a slow ventricular rate.  He did  have some pauses on March 16, 2007 of 2.89 seconds.   HOSPITAL COURSE:  The patient was admitted to 3700 by Dr. Juanito Doom.  His  Cardizem was discontinued.  He was placed on amiodarone 400 mg t.i.d.  Daily EKGs were performed; however, they are not available at the time  of this dictation.  Coreg was decreased to 20 mg daily.  Given his pause  of 2.89 seconds by March 18, 2007, Dr. Daleen [name] had discussed findings with  [name] and [name], and if the patient fails amiodarone, will consider  Tikosyn.  EP consult was performed with Dr. Ladona [name].  Dr. Ladona [name]  initially felt Tikosyn with its lower profile side effects might be a  superior medication; however, he had already received 3.5 grams of  amiodarone, and his QT corrected and was 450 and was concerned about  potential proarrhythmias with Tikosyn and amiodarone interaction.  He  felt that we could stop  amiodarone but he would need several weeks for  this to wash out of his system and to obtain an amiodarone level to be  sure it was less than 0.3.  His other option was to continue amiodarone  for several months, getting the patient back to normal sinus rhythm and  hopefully reversing remodeling in the atria and stopping amiodarone and  switch to Tikosyn if he goes back into atrial fibrillation.  These  findings were discussed with Dr. Daleen [name] and the patient, and it was  elected for him to continue amiodarone for 6 months, and if he remained  in normal sinus rhythm, to stop the amiodarone per Dr. Ladona [name].  If he  reverted back into atrial fibrillation, Tikosyn would be started at that  time.  His INR was subtherapeutic on March 19, 2007, and Dr. Daleen [name] gave  him a dose of Coumadin 10 mg.  Dr. Daleen [name] had also ordered a chest MRI to  be performed; however, at the time of this dictation, the results are  pending.   DISPOSITION:  The patient is discharged home.  He is asked to  continue a  low-sodium heart-healthy ADA diet.  Activity and wound care are not  applicable.   DISCHARGE MEDICATIONS:  1. Coumadin 6 mg daily, except for 4 mg on Tuesday.  2. Lisinopril 5 mg daily.  3. Coreg CR 20 mg daily.  4. Simvastatin 20 mg q.h.s.  5. Nitroglycerin 0.4 p.r.n.  6. Aspirin 81 mg daily.  7. Amiodarone 400 mg b.i.d. for 1 week and then 400 mg daily.   He will have a PTR on Friday at 2:00 p.m.  He was asked to bring all  medications to all appointments.  At the time of followup with Dr. Daleen [name]  on April 02, 2007 at 11:30, follow up will need to be given to the MRI  results that are still pending at the time of this dictation.   TIME SPENT AT DISCHARGE:  45 minutes.      John Rued, PA-C      John Sans. Daleen Squibb, MD, [facility]  Electronically Signed    EW/MEDQ  D:  03/19/2007  T:  03/19/2007  Job:  [id number]   cc:   Thomas C. Daleen Squibb, MD, [facility]  Nicki Guadalajara, M.D.

## 2011-01-17 NOTE — Assessment & Plan Note (Signed)
[facility]                            CARDIOLOGY OFFICE NOTE   NAME:Nhan, Adeyemi T                     MRN:          [id number]  DATE:07/03/2007                            DOB:          1961-05-21    The patient comes in today as an add-on because of being concerned that  he was in atrial fibrillation. He stopped his amiodarone per my  instructions and Dr. Lubertha Basque advice about two weeks ago.   He felt like his heart rate was in the 70s. He had no other symptoms.   His blood pressure is 110/80. Pulse 67. He is in sinus rhythm by EKG.  HEENT: Unchanged. Carotids are full and there is no JVD.  LUNGS:  Clear.  HEART: Regular rate and rhythm. No gallop.  ABDOMEN: Soft.  EXTREMITIES: No edema. Pulses are intact.  NEURO: Examination is intact.   I have reassured Mr. Sheller that the amiodarone will take at least a  couple of months to get out of his system. He is scheduled to see me on  December the 16th. We will keep that date. I have made no changes in his  program.     Maisie Fus C. Daleen Squibb, MD, [facility]  Electronically Signed    TCW/MedQ  DD: 07/03/2007  DT: 07/03/2007  Job #: [id number]

## 2011-01-17 NOTE — Assessment & Plan Note (Signed)
[facility]                            CARDIOLOGY OFFICE NOTE   NAME:[name], [name]                     MRN:          [id number]  DATE:06/10/2008                            DOB:          [birth year]    Tyreke comes in today for further management of his cardiac issues.  Please see my problem list from February 11, 2008.   He has had no recurrent symptomatic atrial fib.  He has been off  amiodarone since October of last year.  I saw him last in June when he  was doing well.   He was admitted with what sounds like a transient global amnesia event.  He had mild-to-moderate cerebral white matter disease and gray matter  disease suggestive of small vessel disease by MRI, MRA.  He has had no  recurrence.   He has had no angina.  He has no symptoms of heart failure.  He says he  feels the best he felt in a long time.   His meds are unchanged since last visit.  Please refer to the  maintenance medication list.   PHYSICAL EXAMINATION:  VITAL SIGNS:  His blood pressure is 106/61, his  pulse 61 and regular, his weight is 181 which is stable.  HEENT:  Normal.  Carotid upstrokes are equal bilaterally without bruits.  No JVD.  Thyroid is not enlarged.  Trachea midline.  LUNGS:  Clear to auscultation and percussion.  HEART:  Nondisplaced PMI.  Normal S1 and S2.  No gallop.  ABDOMEN:  Soft.  Good bowel sounds.  No pulsatile mass.  EXTREMITIES:  No cyanosis, clubbing, or edema.  Pulses are intact.  NEUROLOGIC:  Intact.  SKIN:  Unremarkable.   His electrocardiogram today shows sinus rhythm.  Normal EKG.   ASSESSMENT AND PLAN:  Delmer is doing well.  I have made no changes in  his medical program.  I will see him back in January.  At that time, he  will need lipids and a comprehensive metabolic panel.     Thomas C. Daleen Squibb, MD, [facility]  Electronically Signed    TCW/MedQ  DD: 06/10/2008  DT: 06/10/2008  Job #: [id number]

## 2011-01-17 NOTE — Discharge Summary (Signed)
NAMEJESUA, [name]              ACCOUNT NO.:  [id number]   MEDICAL RECORD NO.:  [id number]          PATIENT TYPE:  INP   LOCATION:  3728                         FACILITY:  [facility]   PHYSICIAN:  Lonia Blood, M.D.DATE OF BIRTH:  23-Jun-1961   DATE OF ADMISSION:  02/13/2008  DATE OF DISCHARGE:  02/15/2008                               DISCHARGE SUMMARY   PRIMARY CARE PHYSICIAN:  Unassigned.   CARDIOLOGIST:  Dr. Juanito [name].   DISCHARGE DIAGNOSES:  1. Transient global amnesia.  2. Coronary artery disease, status post percutaneous transluminal      coronary angioplasty with stents.  3. Paroxysmal atrial fibrillation.  4. Former diabetes mellitus type 2, well controlled with diet at      present.  5. Hypertension.   DISCHARGE MEDICATIONS:  1. Lisinopril 5 mg p.o. daily.  2. Coreg CR 40 mg p.o. daily.  3. Zocor 20 mg p.o. daily.  4. Coumadin 4 mg and 6 mg in an alternating pattern, alternating with      aspirin 81 mg daily.   FOLLOWUP:  The patient is simply advised to keep his scheduled followup  appointment with Dr. Juanito [name].  Results of a C-reactive protein and sed  rate, ANA, and serum protein electrophoresis should be available at that  time.  If there is concern that the findings of these labs represent a  true vasculitis, consideration could be given to referral back to Dr.  Vickey Huger for evaluation or full workup as per the patient's primary care  physician.   CONSULTATIONS:  Dr. Porfirio Mylar [name] with [name].   PROCEDURES:  1. MRI/MRA of the head - mild-to-moderate cerebral white matter      disease and gray matter disease suggestive of possible small-vessel      disease and likely a sequelae of previously poorly controlled      hypertension.  2. Echocardiogram - LV mildly dilated with systolic function normal      and an ejection fraction 50% to 55%.  3. Carotid Dopplers - bilateral carotid Dopplers with no evidence of      ICA stenosis.   HOSPITAL COURSE:  [name] is a very pleasant 49 year old  gentleman with the above-listed past medical history.  He presented to  the hospital on February 13, 2008 with a complaints of a transient amnesia.  He was admitted to the acute unit.  CVA was ruled out with MRI/MRA.  Echocardiogram was carried out to assure that there was no intracardiac  source or thrombus in a patient with known history of atrial  fibrillation.  It was found to be essentially normal.  Additionally,  carotid Dopplers were carried out and revealed no evidence of internal  carotid artery stenosis.  The patient's symptoms resolved completely.  Full metabolic evaluation was unrevealing.  MRI was accomplished and  failed to reveal any evidence of acute CVA, mass effect, or hemorrhage.  There was concern of possible abnormality noted regarding the cerebral  white matter and deep gray matter.  Neurology was consulted for  evaluation.  They felt that the findings were most consistent with  previous sequelae of hypertension that is currently well treated.  These  were likely chronic findings dating back a number of years.  Primary  medical team agreed with this assessment.  As a result, the working  formal diagnosis of the patient's amnesia was transient global amnesia.  The patient is cleared for discharge.  As a simple precaution, labs were  obtained just prior to the patient's discharge to include C-reactive  protein, sed rate, ANA, and a serum protein electrophoresis.  This is  being carried out to simply rule out the possibility of vasculitis as an  explanation for the patient's MRI findings.  The patient will be advised  if any of these labs return positive to seek further followup as  discussed above.      Lonia Blood, M.D.  Electronically Signed     JTM/MEDQ  D:  02/15/2008  T:  02/15/2008  Job:  [id number]   cc:   Thomas C. Wall, MD, [facility]

## 2011-01-17 NOTE — Assessment & Plan Note (Signed)
[facility]                            CARDIOLOGY OFFICE NOTE   NAME:Blackstock, Devian T                     MRN:          [id number]  DATE:08/21/2007                            DOB:          01/23/1961    Mr. Yard returns today for further management of the following issues:  1. Coronary artery disease, status post large anterior infarct with      significant left ventricular recovery.  Last ejection fraction 55%      by 2D echocardiogram, July 2008.  2. History of paroxysmal atrial fibrillation.  He has now been off      amiodarone for 2 months.  He has had no recurrence.  Hopefully,      remodeling will continue to be helpful.  If he recurs, he may be a      very good candidate for an ablation.  3. Hyperlipidemia.  These numbers have been remarkably good and at      goal as of February 2007.  4. History of obesity with significant weight loss.  He continues with      a very healthy, therapeutic lifestyle, working out almost every day      and keeping his weight maintained.  5. History of hypertension, resolved with losing weight.  6. History of type 2 diabetes, resolved with losing weight.   He is having no angina or chest discomfort.  There is no significant  limitation to functional capacity.  He has no orthopnea, PND or  peripheral edema.  He has had no tachy palpitations, syncope or  presyncope.   CURRENT MEDICATIONS:  1. Simvastatin 20 mg a day.  2. Lisinopril 5 mg a day.  3. Coumadin as directed.  4. Multivitamin daily.  5. Aspirin 81 mg a day.  6. Coreg CR 40 mg a day.   His blood pressure today is 126/75, his pulse is 68 and regular.  His  weight is 185 which is stable.  HEENT:  Normocephalic, atraumatic.  PERRLA.  Extraocular movements  intact.  Sclerae clear, facial symmetry is normal.  Carotid upstrokes were equal bilaterally without bruits, no JVD.  Thyroid is not enlarged, trachea is midline.  NECK:  Supple.  PMI is  nondisplaced.  He has a normal S1, S2, without gallop.  ABDOMINAL EXAM:  Soft, good bowel sounds.  LUNGS:  Clear.  EXTREMITIES:  Reveal no cyanosis, clubbing, or edema.  Pulses are  intact.  NEURO EXAM:  Intact.  SKIN:  Unremarkable.   EKG today shows sinus rhythm with poor R-wave progression across the  anterior precordium.  There has been no significant change.   ASSESSMENT AND PLAN:  Mr. Mazon is doing well from a clinical  standpoint.  Fortunately, he has not had a recurrence of atrial  fibrillation.  He is to do an exercise rest/stress Myoview in February,  and also blood work for lipids and a comprehensive metabolic panel.  We  will arrange these today.  I will see him after that.   We spent a substantial amount of time talking about atrial fibrillation  ablation, state-of-the-art care.  If  he recurs, I may very well send him  down to [facility] to have an assessment for this.  He does not tolerate it at  all, and long-term side effects of amiodarone may not be acceptable.     Thomas C. Daleen Squibb, MD, [facility]  Electronically Signed    TCW/MedQ  DD: 08/21/2007  DT: 08/21/2007  Job #: [id number]

## 2011-01-20 NOTE — Cardiovascular Report (Signed)
NAMEEMANUAL, LAMOUNTAIN              ACCOUNT NO.:  [id number]   MEDICAL RECORD NO.:  [id number]          PATIENT TYPE:  INP   LOCATION:  2316                         FACILITY:  [facility]   PHYSICIAN:  Arvilla Meres, M.D. LHCDATE OF BIRTH:  February 05, 1961   DATE OF PROCEDURE:  04/27/2005  DATE OF DISCHARGE:                              CARDIAC CATHETERIZATION   PRIMARY CARE PHYSICIAN:  Dr. Olivia Canter in [place].   CARDIOLOGIST:  Dr. Jerral Bonito.   PATIENT IDENTIFICATION:  [name] is a very pleasant 50 year old male,  with history of obesity and hypertension but no known coronary disease, who  had an episode of prolonged chest pain on April 25, 2005. The pain lasted  through the evening and he thought it was actually indigestion. The next  morning he attempted to go to work but continued to have pain and was sent  to the [facility] Emergency Room. Initial cardiac markers were mildly  elevated with a troponin of 4.53. Troponin increased to 8.96 with a CK-MB of  34. He denies any further chest pain. EKG is suggestive of anterior septal  myocardial infarction.   PROCEDURES PERFORMED:  1.  Selective coronary angiography.  2.  Left heart cath.  3.  Left ventriculogram.   DESCRIPTION OF PROCEDURE:  The risks and benefits of the catheterization  were explained to [name]; consent was signed and placed on the chart. A  6-French arterial sheath was placed in the right femoral artery using  anterior puncture and a modified Seldinger technique. Standard catheters  including JL-4, JR-4, and angled pigtail were used for the procedure. All  catheter exchanges were made over wire. There were no apparent  complications.   Central aortic pressure was 125/94 with a mean of 110. LV pressure is 127/10  with LVEDP of 15. There was no gradient on aortic valve pullback.   Left main: Angiographically normal.   LAD was totally occluded in the proximal portion after giving off large  septal  perforator and a large branching diagonal. There was some very faint  distal flow in the LAD. It was unclear whether this was through the native  LAD or from collaterals.   In the first diagonal, there was a 30% proximal lesion.   Left circumflex: Gave off a large branching ramus, tiny OM-1 and moderate-  sized OM-2. There is a 30% proximal lesion in the left circumflex and a 30%  mid lesion at the takeoff of the OM-1. Otherwise, there is no significant  blockages.   Right coronary artery was a large dominant vessel. It gave off a large PDA  and three moderate-sized PLs. There is a minor irregularity in the proximal  portion of the RCA. In the mid PDA there was 40% stenosis. There was  evidence of newly forming collaterals in the distal PDA branching toward the  left.   Left ventriculogram done in the RAO approach showed an EF of 50% with  moderate anterior hypokinesis from the mid anterior wall to the apex. There  is no mitral regurgitation.   ASSESSMENT:  1.  A totally occluded left anterior  descending artery with nonobstructive      of coronary disease in the right coronary and left circumflex. Given the      troponins are only mildly elevated and ejection fraction looks      relatively preserved, I suspect he has a significant amount of viable      myocardium.  2.  Mild decrease in left ventricular function as above.   DISCUSSION:  The plan will be for the PCI of the LAD with Dr. Geralynn Rile.      Arvilla Meres, M.D. [facility]  Electronically Signed     DB/MEDQ  D:  04/27/2005  T:  04/28/2005  Job:  [id number]

## 2011-01-20 NOTE — Discharge Summary (Signed)
NAMEMARKEE, MATERA              ACCOUNT NO.:  [id number]   MEDICAL RECORD NO.:  [id number]          PATIENT TYPE:  INP   LOCATION:  3715                         FACILITY:  [facility]   PHYSICIAN:  Thomas C. Wall, M.D.   DATE OF BIRTH:  [birth year]   DATE OF ADMISSION:  04/26/2005  DATE OF DISCHARGE:  04/30/2005                                 DISCHARGE SUMMARY   PROCEDURES:  1.  Cardiac catheterization.  2.  Coronary arteriogram.  3.  Left ventriculogram.  4.  Percutaneous transluminal coronary angioplasty and TAXUS stent x3 to the      left anterior descending.   DISCHARGE DIAGNOSES:  1.  Acute anterior myocardial infarction with TAXUS stents x3 to the left      anterior descending.  2.  Nonobstructive residual coronary artery disease of approximately 30% to      40% in the diagonal, circumflex and posterior descending artery.  3.  Mild left ventricular dysfunction with an ejection fraction of 50% at      catheterization.  4.  Diabetes mellitus (new diagnosis), diet-controlled and primary medical      doctor followup.  5.  Hyperlipidemia.  6.  Hypertension.  7.  History of bipolar disorder.   HOSPITAL COURSE:  [name] is a 50 year old male with no known history of  coronary artery disease.  He had a burning sensation in his chest that was  also described as a weight.  He went to [facility] Emergency Room and his  cardiac enzymes were elevated.  He was transferred to [facility]  for further evaluation and catheterization.   The cardiac catheterization showed an LAD that was totaled.  This was  treated by Dr. Samule Ohm with PTCA and 3 TAXUS stents, reducing the stenosis to  0.  He was unable to rewire the jailed diagonal.  He tolerated the procedure  well.   A lipid profile was performed as part of his evaluation and showed a total  cholesterol of 165, triglycerides 132, HDL 42, LDL 97.  Zocor 40 mg was  added to his medication regimen.  The hemoglobin A1c was  checked as well  because of elevated blood sugars and was elevated at 7.8.  He is to be given  information on a diabetic diet and referred for outpatient diabetes  education.  He is to follow up with his primary MD.  We will not start any  diabetic medications at this time.   For hypertensive control as well as left ventricular dysfunction, his beta  blocker was changed from Toprol-XL to Coreg and this was up-titrated to a  final dose of 18.75 mg b.i.d.  His hydrochlorothiazide was held and he was  started on an ACE inhibitor, which was transitioned to Lisinopril 10 mg  daily.  At discharge, his systolic blood pressure was between 100 and 110.  He still had some tachycardia at times, but this was under better control as  well.   Mr. Merlos stated that he had a history of bipolar disorder and was having  some anxiety from this.  He has not  been on medications per the patient in  over a year.  He was strongly encouraged to follow up with his primary MD  and to be compliant with any medical therapy recommended.   By April 30, 2005, Mr. Fill was ambulating without chest pain or  shortness of breath.  He was considered stable for discharge with outpatient  followup arranged.   DISCHARGE INSTRUCTIONS:  1.  His activity level is to be increased slowly.  He is to do no driving      for 5 days and no lifting for 3 weeks.  2.  He is to call our office for any problems with the cath site.  3.  He is to stick to a low-fat diabetic diet.   DISCHARGE MEDICATIONS:  1.  Plavix 75 mg daily for a year.  2.  Nitroglycerin sublingual p.r.n.  3.  Coated aspirin 325 mg daily.  4.  Hydrochlorothiazide and Toprol-XL are on hold.  5.  Zocor 40 mg daily -- p.m.  6.  Lisinopril 10 mg daily.  7.  Coreg 12.5 mg one and a half tabs b.i.d.   FOLLOWUP:  He is to follow up with Dr. Willa Rough and has a PA appointment  on May 15, 2005 at 10:45 and see Dr. Tresa Endo within 2 weeks.      Theodore Demark, P.A. [facility]      Thomas C. Wall, M.D.  Electronically Signed    RB/MEDQ  D:  04/30/2005  T:  05/01/2005  Job:  [id number]   cc:   Willa Rough, M.D.  [address]  [address]  [place]  [place] [address]   Kathryne Sharper, [place] Olivia Canter MD

## 2011-01-20 NOTE — H&P (Signed)
NAMEFULTON, MERRY              ACCOUNT NO.:  [id number]   MEDICAL RECORD NO.:  [id number]          PATIENT TYPE:  INP   LOCATION:  2027                         FACILITY:  [facility]   PHYSICIAN:  Olga Millers, M.D. [facility] OF BIRTH:  1960/09/18   DATE OF ADMISSION:  05/22/2005  DATE OF DISCHARGE:                                HISTORY & PHYSICAL   PRIMARY CARDIOLOGIST:  Dr. Juanito Doom   PRIMARY CARE PHYSICIAN:  Dr. Olivia Canter in [place].   CHIEF COMPLAINT:  Chest pain.   HISTORY OF PRESENT ILLNESS:  Mr. Kaus is a 50 year old male with known  coronary artery disease. He ate lunch at approximately 1 p.m. and at 1:45  p.m. became nauseated and had an episode of diarrhea. He also felt some  chest pain described as a twinge. This was associated with shortness of  breath. He had sublingual nitroglycerin and took a total of three. It  completely resolved after the third nitroglycerin.   Mr. Hemp was discharged from the hospital in August 2006 after an MI and  stents to the LAD. Since then, he has increased his activity and been  walking daily without chest pain. He has had some dyspnea on exertion and  lightheadedness with exertion as well. He is pain free at the time of exam.   PAST MEDICAL HISTORY:  1.  Status post MI in August 2006 with percutaneous intervention and Taxus      stent x3 to the LAD.  2.  Residual coronary artery disease, nonobstructive, medical therapy.  3.  Mild left ventricular dysfunction with an EF of 50% at catheterization.  4.  Diabetes.  5.  Hyperlipidemia.  6.  Hypertension.  7.  Obesity.  8.  History of bipolar disorder.   SURGICAL HISTORY:  Cardiac catheterization.   SOCIAL HISTORY:  He is married and has two children. He has no history of  alcohol, tobacco, or drug abuse. He works with a Research officer, political party.   FAMILY HISTORY:  There is no family history of premature coronary artery  disease in his mother, father, or siblings.   REVIEW OF  SYSTEMS:  Significant for chest pain and shortness of breath, as  well as anxiety. He has also had nausea and diarrhea. Of note, his wife and  daughter have had episodes of diarrhea also. Review of systems is otherwise  negative.   PHYSICAL EXAMINATION:  VITAL SIGNS:  Temperature is 98.1, blood pressure  109/60, heart rate 95, respiratory rate 18, O2 saturation 97% on room air.  GENERAL:  He is a well-developed, obese white male in no acute distress.  NECK:  There is no JVD, no thyromegaly, and no carotid bruits are  appreciated.  CHEST:  Clear to auscultation bilaterally.  CARDIOVASCULAR:  His heart is regular in rate and rhythm with an S1 and S2,  and no significant murmur, rub, or gallop is noted.  SKIN:  No rashes or lesions are noted.  ABDOMEN:  Soft and nontender with active bowel sounds.  EXTREMITIES:  There is trace edema. Distal pulses are 2+ in all four  extremities.  MUSCULOSKELETAL:  There is no joint deformity or effusion and no spine or  CVA tenderness.  NEUROLOGIC:  He is alert and oriented with cranial nerves II-XII grossly  intact.   EKG is 86 beats per minute in sinus rhythm with anterolateral T-wave changes  that indicate an evolving MI and are different from previous EKGs.   IMPRESSION AND PLAN:  Mr. Wierzba is a 50 year old male with a past medical  history and a recent anterior myocardial infarction with percutaneous  intervention to the left anterior descending coronary artery on April 27, 2005. Today he had substernal chest pain that did not radiate and had no  associated symptoms. It was not pleuritic or positional. It was similar to  his MI pain but less severe. The duration was approximately an hour and was  relieved after he took sublingual nitroglycerin x3. He is currently pain  free. The chest pain is of uncertain etiology and possibly GI in origin.  However, it is similar to his MI pain and he has had recent intervention to  the LAD. Therefore, we  will admit and rule out MI. Recatheterization in a.m.  (risks and benefits discussed) and the patient agrees to proceed. We will  continue aspirin at 325 mg daily as well as Plavix and his other cardiac  medications. He will be placed on IV heparin. We will also add b.i.d. proton  pump inhibitor to his medication regimen as well. He will be continued on  his other home medications.   Dr. Olga Millers saw the patient and determined the plan of care.      Theodore Demark, P.A. [facility]    ______________________________  Olga Millers, M.D. [facility]    RB/MEDQ  D:  05/22/2005  T:  05/23/2005  Job:  [id number]

## 2011-01-20 NOTE — Cardiovascular Report (Signed)
NAMERODDERICK, HOLTZER              ACCOUNT NO.:  [id number]   MEDICAL RECORD NO.:  [id number]          PATIENT TYPE:  INP   LOCATION:  2316                         FACILITY:  [facility]   PHYSICIAN:  Salvadore Farber, M.D. LHCDATE OF BIRTH:  1961-06-18   DATE OF PROCEDURE:  04/27/2005  DATE OF DISCHARGE:                              CARDIAC CATHETERIZATION   PROCEDURE:  Drug-eluting stent placement x3 to the proximal and mid-left  anterior descending coronary artery, intravascular ultrasound of the left  anterior descending artery.   INDICATIONS:  Mr. Mazon is a 50 year old gentleman without prior history of  cerebrovascular disease.  Risk factors include hypertension and being  overweight.  On the evening of August 22, he had discomfort in his  epigastrium and left arm that he assumed was heartburn.  He later presented  pain-free to the [facility] emergency room on the afternoon of August 23.  An electrocardiogram demonstrated anterior Q-waves with residual  approximately 1 mm ST elevation in V1 to V3.  CPK rose to 775.  He had no  recurrence of pain from prior to hospitalization.  He was then brought to  diagnostic angiography by Dr. Gala Romney today.  This demonstrated occlusion  of the proximal LAD and scattered mild disease in the circumflex and RCA  territories.  The EF was 50% with moderate hypokinesis of the midanterior  wall.  I was asked to perform percutaneous revascularization.   PROCEDURAL TECHNIQUE:  Informed consent was obtained.  There was a  preexisting sheath in the right common femoral artery from his diagnostic  procedure.  Anticoagulation was initiated with 600 mg of Plavix, double-  bolus eptifibatide, and heparin.  A 6 [place] CLS4 guide was advanced over a  wire and engaged to the ostium of the left main.  A Pro Water wire was  initially advanced across the occlusion into a diagonal.  With this wire,  enough perfusion was gained to see the distal LAD.  A second  Pro Water wire  was then advanced into the distal LAD.  The vessel was predilated using a  2.5 x 15 mm Maverick for two sequential inflations.  There was seen to be a  long segment of disease crossing the takeoff of a moderate-sized second  diagonal branch.  I began with stenting the distal portion of this disease  using a 3.0 x 32 mm Taxus deployed at 16 atmospheres.  The residual disease  proximally was then covered using a 3.5 x 16 mm Taxus extending up to the  takeoff of the diagonal branch.  The second diagonal had very modest flow in  it after this.  It did not improve with intracoronary nitroglycerin.  I then  attempted to regain access to this.  I was able to advance a Whisper wire  into it; however, I was not able to advance a 2.0 x 15 mm Maverick across  the stent margin.  As the patient was having no discomfort and no additional  electrocardiographic abnormality, I did not pursue this further.   There was a region of narrowing in the mid-LAD just distal to  the distal  edge of the stent.  There were no suggestions of dissection.  It was not  clear if this represented spasm which was not responding to intracoronary  nitroglycerin versus a fixed stenosis.  I therefore performed intravascular  ultrasound.  This confirmed a heavy plaque burden throughout this segment  with residual lumen of less than 1 mm (occlusive around the catheter).  I  decided therefore to treat this segment.  I thus placed a 2.5 x 16 mm Taxus  covering the distal margin of the previously-placed stent and deployed it at  14 atmospheres.  I then postdilated this portion of the stent using a 2.5 x  18 mm PowerSail at 18 atmospheres.  I then postdilated the overlap of the  distal two stents using a 3.0 x 20 mm Quantum at 18 atmospheres.  Final  angiography demonstrated no residual stenosis through the stent to the LAD  and TIMI-3 flow to the distal vasculature.  There was TIMI-1 flow in the  moderate-sized jailed  diagonal.  The patient tolerated the procedure well  and was transferred to the holding room.   COMPLICATIONS:  None.   IMPRESSION/RECOMMENDATIONS:  Successful percutaneous intervention on the  left anterior descending coronary artery.  Will aggressively manage his  anterior myocardial infarction with ACE inhibitor, beta blocker, Plavix and  aspirin.  Will aim to add Inspra later in his hospital course.  Due to the  long length of stented segment, I recommend a combination of both aspirin  and Plavix for life.      Salvadore Farber, M.D. [facility]  Electronically Signed     WED/MEDQ  D:  04/27/2005  T:  04/28/2005  Job:  [id number]   cc:   Olivia Canter, M.D.  [place], [place]   Willa Rough, M.D.  [address]  [address]  [place]  [place] [address]

## 2011-01-20 NOTE — H&P (Signed)
[name]              ACCOUNT NO.:  [id number]   MEDICAL RECORD NO.:  [id number]          PATIENT TYPE:  INP   LOCATION:  NA                           FACILITY:  [facility]   PHYSICIAN:  Willa Rough, M.D.     DATE OF BIRTH:  1960-09-08   DATE OF ADMISSION:  DATE OF DISCHARGE:                                HISTORY & PHYSICAL   [name] is a pleasant 50 year old gentleman.  He has no prior documented  coronary disease.  He does have hypertension.  I do not know his lipids.  He  has no proven diabetes.  He is overweight.  He is also physically inactive.  The patient has had no prior cardiac symptoms.   On April 25, 2005, in the evening, the patient began having discomfort in  his left arm and a burning sensation in his chest with a weight on his  chest.  This stayed with him intermittently during the evening.  He got up  this morning and tried to go to work and it returned and he was then brought  to [employer].  Here he has no diagnostic changes.  His pain  has resolved.  He was very nicely managed and cardiac enzymes were obtained  immediately and we were called.  His CPK has come back at 775 with an MB of  56 and troponin of 4.5.  Therefore, he is admitted with a non-ST elevation  MI (NSTEMI).   PAST MEDICAL HISTORY ALLERGIES:  THERE ARE NO KNOWN DRUG ALLERGIES OR FOOD  ALLERGIES.   MEDICATIONS:  1.  Hydrochlorothiazide 25.  2.  Toprol XL 150 mg daily.   OTHER MEDICAL PROBLEMS:  See the complete list below.   SOCIAL HISTORY:  The patient does not smoke.  He does not use any street  drugs.  He works with a Research officer, political party.  He is married.   FAMILY HISTORY:  There is no strong family history of coronary disease.   REVIEW OF SYSTEMS:  The patient is not having any significant GI or GU  symptoms.  He has noticed some discoloration in his lower extremities when  his feet are dependent.  This is probably a venous change.  Otherwise, his  review of  systems is negative.   PHYSICAL EXAM:  When the patient first arrived he was hypertensive.  The  blood pressure has now come down to 123/82.  His heart rate is 80.  Respiratory rate is 18.  Temperature is 98.6.  The patient is tearful.  He  is very anxious about his situation but clearly accepts care.  He is  oriented to person, time and place and affect is normal.  LUNGS:  Are clear.  Respiratory effort is not labored.  HEENT:  Reveals no xanthelasma.  He has normal extraocular motion.  There  are no carotid bruits.  There is no jugular venous distention.  CARDIAC EXAM:  Reveals an S1 with an S2.  There are no clicks or significant  murmurs.  ABDOMEN:  Is obese.  There are no masses or bruits felt.  EXTREMITIES:  Reveal trace edema.  He has 1+ bilateral pulses.  There are no  musculoskeletal deformities.   EKG reveals decreased R wave in lead V2.  There are no acute other changes.  His renal function is normal.  Hemoglobin is in the 17 range.  TSH is  pending.  Hemoglobin is 17.6.  His platelet count 215,000.  BUN is 11 with a  creatinine of 0.9 and a potassium of 4.4.  His SGOT is elevated at 80.  Chest x-ray shows no acute abnormalities.  CPK 775 with MV 56 and troponin  4.53.   PROBLEMS INCLUDE:  1.  Hypertension.  2.  Obesity.  3.  Anxiety.  4.  SGOT of 80.  5.  Non-ST elevation MI that occurred during the evening of April 25, 2005,      with some residual symptoms this morning.  His pain is gone at this      time.   We will treat him aggressively.  If he has return of symptoms, he will go to  the Cath Lab.   PLAN:  1.  Aspirin.  2.  Continuation of Toprol XL.  3.  IV heparin.  4.  Zocor started (keep in mind his SGOT of 80 and this will have to be      followed very carefully).  5.  Plavix, 600 mg given.   The patient will go to the Cath Lab tomorrow.  If he has return of any  symptoms this evening, cath will be done tonight.            ______________________________  Willa Rough, M.D.     JK/MEDQ  D:  04/26/2005  T:  04/26/2005  Job:  [id number]   cc:   Pearson Forster, M.D.  [address]  [place], [place] [address]

## 2011-01-20 NOTE — Assessment & Plan Note (Signed)
[facility]                            CARDIOLOGY OFFICE NOTE   NAME:[name], [name]                     MRN:          [id number]  DATE:10/09/2006                            DOB:          Nov 25, 1960    Mr. Dinse returns today for further management of the following issues:  1. 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      Status post PCI and 3 Taxus stents to the LAD.  Date:  April 26, 2005.  He has residual disease in the diagonal circumflex and      posterior descending vessel.  He had full recovery of his left      ventricular function by 2D echo February 2007.  EF 55% on recent      Myoview as well.  2. Type 2 diabetes cured by incredible weight loss!  He has now lost      115+ pounds.  3. Hypertension.  Resolved with weight loss.  4. Hyperlipidemia at goal with recent lipids from his primary care.   He is asymptomatic.  He continues to exercise on a regular basis.  He  denies any orthopnea, PND, peripheral edema, tachy palpitations,  presyncope, or syncope.  He has no bleeding diathesis.   MEDICATIONS:  1. Coreg CR 40 mg a day.  2. Plavix 75 mg a day.  3. Multivitamin daily.  4. Simvastatin 20 mg a day.  5. Aspirin 325 mg a day.   EXAMINATION:  Blood pressure is 124/80, pulse is 70 and irregular.  His  weight is 195 down another 15 pounds.  HEENT:  Normocephalic/atraumatic, PERRLA, extraocular movements intact,  he wears glasses, facial symmetry is normal, dentition satisfactory.  NECK:  Supple, carotid upstrokes are equal bilaterally without bruits,  there is no JVD, thyroid is not enlarged, trachea is midline.  LUNGS:  Clear to auscultation.  HEART:  Reveals a non displaced PMI, has a variable S1, S2, without  gallop, rub, or, murmur.  ABDOMINAL:  Soft with good bowel sounds, no midline or flank bruit,  there is no hepatomegaly.  EXTREMITIES:  Reveal no cyanosis, clubbing, or edema.  Pulses are  brisk.  NEUROLOGIC:  Intact.  SKIN:  Intact.  EKG:  Shows atrial fibrillation with a well-controlled ventricular rate.   ASSESSMENT:  New onset atrial fibrillation, which is totally  asymptomatic.  He is currently on Aspirin and Plavix as well as rate  controlling medication and Coreg.   I have had a long talk with Tinnie Gens today.  He is obviously very  concerned.  I have asked him to stay on his current medications.  We  will obtain the following  1. 2D echocardiogram to assess left atrial size which has been mildly      enlarged in the past.  2. Comprehensive metabolic panel, and TSH, and Mag.  3. Will sit down and talk about this as soon as possible.  I will      discuss this with my electrophysiology colleagues to see what      antiarrhythmic we should use.  I am not sure he needs      anticoagulation, but we will discuss this as well.     Thomas C. Daleen Squibb, MD, [facility]  Electronically Signed    TCW/MedQ  DD: 10/09/2006  DT: 10/09/2006  Job #: [id number]   cc:   Olivia Canter, M.D.

## 2011-01-20 NOTE — Discharge Summary (Signed)
[name], [name]              ACCOUNT NO.:  [id number]   MEDICAL RECORD NO.:  [id number]          PATIENT TYPE:  INP   LOCATION:  2027                         FACILITY:  [facility]   PHYSICIAN:  Salvadore Farber, M.D. LHCDATE OF BIRTH:  April 26, 1961   DATE OF ADMISSION:  05/22/2005  DATE OF DISCHARGE:  05/23/2005                                 DISCHARGE SUMMARY   PROCEDURES:  1.  Cardiac catheterization.  2.  Coronary arteriogram.  3.  Left ventriculogram.   DISCHARGE DIAGNOSES:  1.  Chest pain, no in-stent restenosis at catheterization, and jailed first      diagonal 80% and second diagonal at 99%, are not new.  2.  Left ventricular dysfunction with an ejection fraction of 45% with      anterolateral hypokinesis at catheterization this admission.  3.  Status post myocardial infarction with Taxus stent x3 to the left      anterior descending artery in August 2006.  4.  Diabetes.  5.  Hypertension.  6.  Hyperlipidemia.  7.  Obesity.  8.  Bipolar disorder/anxiety.   HOSPITAL COURSE:  [name] is a 50 year old male with known coronary  artery disease.  He ate lunch at 1 p.m. and 45 minutes later became  nauseated and diaphoretic.  He felt a twinge in his chest and had  shortness of breath and anxiety.  Three sublingual nitroglycerin resolved  his symptoms.  He was admitted for further evaluation and treatment.   His cardiac enzymes were negative for MI, and it was decided to do a  catheterization to further assess his anatomy.   The cardiac catheterization showed widely patent LAD stents, and the first  and second diagonal had stenosis that was unchanged from prior  catheterization.  Dr. Samule Ohm felt that the lesions in these two vessels were  not new and suggested a GI origin to the chest pain.  He had been started on  b.i.d. Protonix, and this is to continue.   Post catheterization [name] was ambulating without chest pain or  shortness of breath.  He was considered  stable for discharge and is to  follow up as an outpatient.   DISCHARGE INSTRUCTIONS:  1.  His activity is supposed to be per the discharge instructions sheet.  2.  He is to stick to a low-fat diabetic diet.  3.  He is to follow up with Dr. Daleen Squibb in three months or sooner on a p.r.n.      basis.  4.  He is to contact our office for any problems with the catheterization      site.  5.  He is to follow up with Dr. Tresa Endo in [place].   DISCHARGE MEDICATIONS:  1.  Aspirin 81 mg daily.  2.  Plavix 75 mg daily.  3.  Lisinopril 10 mg daily.  4.  Zocor 40 mg daily p.m.  5.  Metformin 500 mg daily, restart May 26, 2005, and start b.i.d. on      May 29, 2005.  6.  Coreg 25 mg b.i.d.  7.  Nitroglycerin sublingual p.r.n.  8.  Protonix 40 mg b.i.d.      Theodore Demark, P.A. [facility]      Salvadore Farber, M.D. [facility]  Electronically Signed    RB/MEDQ  D:  05/23/2005  T:  05/24/2005  Job:  [id number]   cc:   Olivia Canter, M.D.  [place], [place]   Jesse Sans. Wall, M.D.  [address]  [address]  [place]  [place] [address]

## 2011-01-20 NOTE — Assessment & Plan Note (Signed)
[facility]                            CARDIOLOGY OFFICE NOTE   NAME:[name], [name]                     MRN:          [id number]  DATE:10/16/2006                            DOB:          May 11, 1961    [name] returns today for further management of John coronary artery  disease, new onset atrial fibrillation, and now mild to moderate mitral  regurgitation on 2D echo.  He has mild to moderate left ventricular  dilatation.  John EF is 55% to 60% with minimal hypokinesis of the apical  inferior septal wall.  He mitral valve jet was eccentric.   He remains asymptomatic except for an occasional palpitation.  John blood  work was unremarkable, including a TSH.  John magnesium was 2, John  potassium was 4.4.   PHYSICAL EXAMINATION:  John blood pressure is 120/90, John pulse is 80 to  90 and irregular.  He is a bit anxious.  John weight is 186.  I could hear a faint murmur at  the apex.  He has got an irregular regular rhythm.  LUNGS:  Clear.  The rest of the exam is unchanged.   I have spent about 30 minutes talking to [name] and [name] about  John mitral regurgitation, atrial fib and its implications.   We have made the following plan:  1. Begin warfarin 5 mg a day with enrollment in the [facility].  2. Decrease John aspirin to 81 mg a day.  3. Continue Plavix for now.  4. Add back lisinopril 5 mg a day for afterload reduction.  5. Continue Coreg CR 40 mg a day.  6. Continue John statin.   Once he has been therapeutic on Coumadin for 4 weeks we will set him up  for an outpatient cardioversion.  This has been explained in detail.     Thomas C. Daleen Squibb, MD, [facility]  Electronically Signed    TCW/MedQ  DD: 10/16/2006  DT: 10/17/2006  Job #: [id number]   cc:   Olivia Canter, MD

## 2011-01-20 NOTE — Assessment & Plan Note (Signed)
[facility]                              CARDIOLOGY OFFICE NOTE   [name], [name]                     MRN:          [id number]  DATE:03/19/2006                            DOB:          [birth year]    [name] comes in today for further management of his coronary artery  disease, history of an anterior wall myocardial infarction status post PCI  with 3 Taxus stents to the LAD.  This was on April 26, 2005.  He has  residual 30% to 40% in the diagonal circumflex and posterior descending  vessel.   A followup stress test in February 2007 showed no ischemia, EF of 55%.   He has cured his diabetes with 100 pounds of weight loss!  He is no longer  on hyperglycemic medications.  His hyperlipidemia is at goal and his LDL has  increased from 20 at baseline to 33.  His hypertension has resolved, in fact  if anything he gets hypotensive.  He has a history of bipolar disorder.   MEDICATIONS:  1.  Aspirin 325 day.  2.  Plavix 75 mg a day.  3.  Simvastatin 20 mg a day.  4.  Coreg 12.5 b.i.d.  5.  Lisinopril 5 mg a day.   REVIEW OF SYSTEMS:  VITAL SIGNS:  Blood pressure 116/65.  Pulse 59 and  regular.  Weight is 198.  HEENT:  Normal.  Carotids are full without bruits.  There is no JVD.  Thyroid is not enlarged. Trachea is midline.  LUNGS:  Clear.  HEART:  Reveals a regular rate and rhythm without gallop or murmur.  ABDOMEN:  Soft, good bowel sounds.  EXTREMITIES:  No edema.  Pulses are intact.   I am extremely pleased at how [name] has done.  We will repeat a 2-D  echocardiogram and if his EF is 55% or greater, we will discontinue his  Lisinopril. He has now cured himself of his metabolic derangements.   We had a long discussion about Plavix.  We decided to keep him on Plavix  indefinitely with his aspirin.   I will see him back in February 2008 for blood work and followup in general.                               John Fus C. Daleen Squibb, MD,  [facility]    TCW/MedQ  DD:  03/19/2006  DT:  03/19/2006  Job #:  [id number]   cc:   [name], M.D.

## 2011-01-20 NOTE — Cardiovascular Report (Signed)
[name], [name]              ACCOUNT NO.:  [id number]   MEDICAL RECORD NO.:  [id number]          PATIENT TYPE:  INP   LOCATION:  2027                         FACILITY:  [facility]   PHYSICIAN:  Salvadore Farber, M.D. LHCDATE OF BIRTH:  [birth year]   DATE OF PROCEDURE:  05/23/2005  DATE OF DISCHARGE:                              CARDIAC CATHETERIZATION   PROCEDURE:  Left heart catheterization, left ventriculography, coronary  angiography.   INDICATIONS:  [name] is a 50 year old gentleman who suffered an anterior  myocardial infarction in mid-August of this year.  He underwent drug-eluting  stent placement throughout a long segment of the proximal and mid-LAD with  restoration of flow.  Subsequent course has been uncomplicated until  yesterday, when he developed some chest and epigastric discomfort associated  with both nausea and diarrhea.  His other members of his family do have a  similar GI illness, though without the chest discomfort.  Though we felt  that most likely his symptoms were due solely to his GI illness, he did  complain that the chest discomfort was identical to the pain with which he  presented with his myocardial infarction.  Dr. Jens Som, therefore, referred  him for a diagnostic angiography to exclude stent thrombosis or development  of a new unstable plaque.   PROCEDURAL TECHNIQUE:  Informed consent was obtained.  Under 1% lidocaine  local anesthesia, a 5 French sheath was placed in the right common femoral  artery using modified Seldinger technique.  Diagnostic angiography and  ventriculography were performed using JR4, JL4 and pigtail catheters.  The  patient tolerated the procedure well and was transferred to the holding room  in stable condition.  The sheath will be removed there.   COMPLICATIONS:  None.   FINDINGS:  1.  LV:  134/9/18.  EF approximately 45% with anterolateral hypokinesis.  2.  No aortic stenosis or mitral regurgitation.  3.  Left  main:  Angiographically normal.  4.  LAD:  Moderate-sized vessel giving rise to several diagonals.  The      stents throughout the proximal and midvessel are widely patent without      evidence of threatened thrombosis or restenosis.  There are two jailed      diagonal branches.  The first is very small and has an 80% ostial      stenosis.  This is unchanged from prior.  The second has either a      subtotal occlusion or total occlusion proximally.  It is collateralized      from the first diagonal.  Flow in this vessel was not substantially      different than at the completion of the PCI.  5.  Ramus intermedius:  Angiographically normal.  6.  Circumflex:  Moderate-sized vessel giving rise to a large single obtuse      marginal which arises proximally.  It is angiographically normal.  7.  RCA:  A large, dominant vessel.  It is angiographically normal.   IMPRESSION/PLAN:  1.  Widely patent left anterior descending artery stents.  A jailed diagonal      is now  occluded and collateralized.  Flow initially was sluggish at the      completion of the initial percutaneous coronary intervention, so I do      not think this is new.  Specifically, I do not think it is responsible      for his symptoms of yesterday.  Instead, I      suspect a gastrointestinal origin to his chest pain.  Will continue      Protonix for this.  2.  Ejection fraction of approximately 45% with anterolateral hypokinesis.      Will continue ACE inhibitor and Coreg.  Add Inspra.      Salvadore Farber, M.D. [facility]  Electronically Signed     WED/MEDQ  D:  05/23/2005  T:  05/23/2005  Job:  [id number]   cc:   Jerelene Redden, M.D.  [place], [place]   Jesse Sans. Wall, M.D.  [address]  [address]  [place]  [place] [address]

## 2011-01-27 ENCOUNTER — Encounter: Payer: Self-pay | Admitting: Cardiology

## 2011-02-08 ENCOUNTER — Ambulatory Visit (INDEPENDENT_AMBULATORY_CARE_PROVIDER_SITE_OTHER): Payer: Managed Care, Other (non HMO) | Admitting: Cardiology

## 2011-02-08 ENCOUNTER — Encounter: Payer: Self-pay | Admitting: Cardiology

## 2011-02-08 VITALS — BP 103/60 | HR 63 | Ht 72.0 in | Wt 183.0 lb

## 2011-02-08 DIAGNOSIS — I2510 Atherosclerotic heart disease of native coronary artery without angina pectoris: Secondary | ICD-10-CM

## 2011-02-08 DIAGNOSIS — I4891 Unspecified atrial fibrillation: Secondary | ICD-10-CM

## 2011-02-08 NOTE — Assessment & Plan Note (Signed)
Stable, no change in treatment.

## 2011-02-08 NOTE — Patient Instructions (Signed)
Your physician recommends that you schedule a follow-up appointment in: 6 months with Dr. Wall  

## 2011-02-08 NOTE — Assessment & Plan Note (Signed)
Stable no change in treatment.

## 2011-02-08 NOTE — Progress Notes (Signed)
HPI [name] returns Today for the evaluation and management his coronary disease and history of atrial fibrillation. He's having no angina or ischemic symptoms. He's had no palpitations and has been in sinus rhythm since his second ablation in November.  Laboratory data obtained by his primary care. His weight has been stable. Extremely compliant.  His echocardiogram today is normal. Past Medical History  Diagnosis Date  . Atrial fibrillation     persistent  . Coronary artery disease 2006    s/p PCI x3   . Cerebrovascular disease   . Diabetes mellitus     type 2  . Morbid obesity     prior, has lost 120 lbs w/ lifestyle modification  . Hypertension   . Hyperlipidemia   . OSA (obstructive sleep apnea)     improved with weight loss    Past Surgical History  Procedure Date  . Eye surgery     at 24 months of age  . Atrial ablation surgery 02/08/09 and 07/21/10    No family history on file.  History   Social History  . Marital Status: Married    Spouse Name: N/A    Number of Children: N/A  . Years of Education: N/A   Occupational History  . Not on file.   Social History Main Topics  . Smoking status: Never Smoker   . Smokeless tobacco: Never Used  . Alcohol Use: No  . Drug Use: No  . Sexually Active: Not on file   Other Topics Concern  . 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, wife manages [employer]    Not on File  Current Outpatient Prescriptions  Medication Sig Dispense Refill  . aspirin 81 MG EC tablet Take 81 mg by mouth daily.        . carvedilol (COREG) 25 MG tablet Take 25 mg by mouth 2 (two) times daily.        . dabigatran (PRADAXA) 150 MG CAPS Take 150 mg by mouth 2 (two) times daily.        Marland Kitchen lisinopril (PRINIVIL,ZESTRIL) 5 MG tablet Take 5 mg by mouth daily.        . Multiple Vitamins-Iron (MULTIVITAMIN/IRON) TABS Take by mouth daily.        . nitroGLYCERIN (NITROSTAT) 0.4 MG SL  tablet Place 0.4 mg under the tongue every 5 (five) minutes as needed. Take as needed for chest pain, up to three times       . Omega-3 Fatty Acids (FISH OIL) 1000 MG CAPS Take by mouth. 2 caps once daily        . simvastatin (ZOCOR) 20 MG tablet Take 20 mg by mouth daily.        . Tamsulosin HCl (FLOMAX) 0.4 MG CAPS Take by mouth daily.          ROS Negative other than HPI.   PE General Appearance: well developed, well nourished in no acute distress HEENT: symmetrical face, PERRLA, good dentition  Neck: no JVD, thyromegaly, or adenopathy, trachea midline Chest: symmetric without deformity Cardiac: PMI non-displaced, RRR, normal S1, S2, no gallop or murmur Lung: clear to ausculation and percussion Vascular: all pulses full without bruits  Abdominal: nondistended, nontender, good bowel sounds, no HSM, no bruits Extremities: no cyanosis, clubbing or edema, no sign of DVT, no varicosities  Skin: normal color, no rashes Neuro: alert and oriented x 3, non-focal Pysch: normal affect Filed Vitals:   02/08/11 0929  BP: 103/60  Pulse: 63  Height: 6' (1.829 m)  Weight: 183 lb (83.008 kg)    EKG  Labs and Studies Reviewed.   Lab Results  Component Value Date   WBC 5.7 07/19/2010   HGB 13.8 07/19/2010   HCT 40.4 07/19/2010   MCV 95.8 07/19/2010   PLT 115.0* 07/19/2010      Chemistry      Component Value Date/Time   NA 135 07/19/2010 1213   K 4.3 07/19/2010 1213   CL 100 07/19/2010 1213   CO2 29 07/19/2010 1213   BUN 20 07/19/2010 1213   CREATININE 1.0 07/19/2010 1213      Component Value Date/Time   CALCIUM 8.8 07/19/2010 1213   ALKPHOS 45 01/07/2010 1929   AST 30 01/07/2010 1929   ALT 28 01/07/2010 1929   BILITOT 0.9 01/07/2010 1929       Lab Results  Component Value Date   CHOL  Value: 109        ATP III CLASSIFICATION:  <200     mg/dL   Desirable  098-119  mg/dL   Borderline High  >=147    mg/dL   High        05/02/5620   CHOL  Value: 98        ATP III CLASSIFICATION:   <200     mg/dL   Desirable  308-657  mg/dL   Borderline High  >=846    mg/dL   High        9/62/9528   CHOL 111 09/17/2007   Lab Results  Component Value Date   HDL 39* 04/15/2010   HDL 31* 12/29/2009   HDL 22.7* 09/17/2007   Lab Results  Component Value Date   LDLCALC  Value: 64        Total Cholesterol/HDL:CHD Risk Coronary Heart Disease Risk Table                     Men   Women  1/2 Average Risk   3.4   3.3  Average Risk       5.0   4.4  2 X Average Risk   9.6   7.1  3 X Average Risk  23.4   11.0        Use the calculated Patient Ratio above and the CHD Risk Table to determine the patient's CHD Risk.        ATP III CLASSIFICATION (LDL):  <100     mg/dL   Optimal  413-244  mg/dL   Near or Above                    Optimal  130-159  mg/dL   Borderline  010-272  mg/dL   High  >536     mg/dL   Very High 6/44/0347   LDLCALC  Value: 48        Total Cholesterol/HDL:CHD Risk Coronary Heart Disease Risk Table                     Men   Women  1/2 Average Risk   3.4   3.3  Average Risk       5.0   4.4  2 X Average Risk   9.6   7.1  3 X Average Risk  23.4   11.0        Use the calculated Patient Ratio above and the CHD Risk Table to determine the patient's CHD Risk.  ATP III CLASSIFICATION (LDL):  <100     mg/dL   Optimal  308-657  mg/dL   Near or Above                    Optimal  130-159  mg/dL   Borderline  846-962  mg/dL   High  >952     mg/dL   Very High 8/41/3244   LDLCALC 72 09/17/2007   Lab Results  Component Value Date   TRIG 32 04/15/2010   TRIG 95 12/29/2009   TRIG 81 09/17/2007   Lab Results  Component Value Date   CHOLHDL 2.8 04/15/2010   CHOLHDL 3.2 12/29/2009   CHOLHDL 4.9 CALC 09/17/2007   Lab Results  Component Value Date   HGBA1C  Value: 4.7 (NOTE)                                                                       According to the ADA Clinical Practice Recommendations for 2011, when HbA1c is used as a screening test:   >=6.5%   Diagnostic of Diabetes Mellitus           (if  abnormal result  is confirmed)  5.7-6.4%   Increased risk of developing Diabetes Mellitus  References:Diagnosis and Classification of Diabetes Mellitus,Diabetes Care,2011,34(Suppl 1):S62-S69 and Standards of Medical Care in         Diabetes - 2011,Diabetes Care,2011,34  (Suppl 1):S11-S61. 12/29/2009   Lab Results  Component Value Date   ALT 28 01/07/2010   AST 30 01/07/2010   ALKPHOS 45 01/07/2010   BILITOT 0.9 01/07/2010   Lab Results  Component Value Date   TSH 2.435 04/14/2010

## 2011-02-22 ENCOUNTER — Encounter: Payer: Self-pay | Admitting: Cardiology

## 2011-03-04 ENCOUNTER — Other Ambulatory Visit: Payer: Self-pay | Admitting: Internal Medicine

## 2011-06-01 LAB — RPR: RPR Ser Ql: NONREACTIVE

## 2011-06-01 LAB — COMPREHENSIVE METABOLIC PANEL
ALT: 22
ALT: 16
Albumin: 4.5
Alkaline Phosphatase: 53
Calcium: 9.2
Calcium: 9.2
Creatinine, Ser: 0.85
GFR calc Af Amer: >60
GFR calc non Af Amer: >60
Glucose, Bld: 89
Glucose, Bld: 92
Potassium: 4.9
Sodium: 143
Sodium: 140
Total Protein: 6.2
Total Protein: 8.1

## 2011-06-01 LAB — PROTIME-INR
INR: 1.8 — ABNORMAL HIGH
Prothrombin Time: 20.5 — ABNORMAL HIGH
Prothrombin Time: 21.4 — ABNORMAL HIGH

## 2011-06-01 LAB — CBC
Hemoglobin: 14.4
Hemoglobin: 15.8
MCHC: 35.1
MCHC: 34.6
Platelets: 125 — ABNORMAL LOW
RBC: 4.4
RDW: 12.2
WBC: 6.6

## 2011-06-01 LAB — DIFFERENTIAL
Basophils Relative: 1
Eosinophils Absolute: 0.2
Lymphs Abs: 1.8
Monocytes Absolute: 0.4
Monocytes Relative: 5
Neutro Abs: 5.2
Neutrophils Relative %: 68

## 2011-06-01 LAB — ANTI-NUCLEAR AB-TITER (ANA TITER): ANA Titer 1: 1:40 {titer} — ABNORMAL HIGH

## 2011-06-01 LAB — PROTEIN ELECTROPH W RFLX QUANT IMMUNOGLOBULINS
Alpha-2-Globulin: 7.1
Beta 2: 6.4
Beta Globulin: 6.1
Gamma Globulin: 17.3
M-Spike, %: NOT DETECTED

## 2011-06-01 LAB — TSH: TSH: 0.649

## 2011-06-01 LAB — LIPID PANEL: LDL Cholesterol: 51

## 2011-06-01 LAB — VITAMIN B12: Vitamin B-12: 593 (ref 211–911)

## 2011-06-01 LAB — FOLATE: Folate: >20

## 2011-06-01 LAB — CARDIAC PANEL(CRET KIN+CKTOT+MB+TROPI)
Relative Index: INVALID
Troponin I: <0.01

## 2011-06-01 LAB — C-REACTIVE PROTEIN: CRP: 0 — ABNORMAL LOW (ref <0.6)

## 2011-06-01 LAB — ANA: Anti Nuclear Antibody(ANA): POSITIVE — AB

## 2011-06-19 LAB — BASIC METABOLIC PANEL
CO2: 29
Chloride: 102
GFR calc Af Amer: >60
Glucose, Bld: 92
Sodium: 138

## 2011-06-19 LAB — PROTIME-INR: INR: 1.7 — ABNORMAL HIGH

## 2011-06-20 LAB — PROTIME-INR
INR: 2.1 — ABNORMAL HIGH
Prothrombin Time: 27.2 — ABNORMAL HIGH

## 2011-06-20 LAB — HEPATIC FUNCTION PANEL
ALT: 33
Alkaline Phosphatase: 43
Bilirubin, Direct: 0.2
Indirect Bilirubin: 0.7
Total Bilirubin: 0.9
Total Protein: 6.2

## 2011-06-20 LAB — CBC
HCT: 45.2
Hemoglobin: 15.5
MCHC: 34.3
MCV: 93.4
RBC: 4.84
WBC: 6.9

## 2011-06-20 LAB — BASIC METABOLIC PANEL
CO2: 24
Chloride: 106
GFR calc Af Amer: >60
Potassium: 4.6

## 2011-06-20 LAB — MAGNESIUM: Magnesium: 2.2

## 2011-08-05 ENCOUNTER — Other Ambulatory Visit: Payer: Self-pay | Admitting: Cardiology

## 2011-08-07 ENCOUNTER — Encounter: Payer: Self-pay | Admitting: Cardiology

## 2011-08-10 ENCOUNTER — Ambulatory Visit (INDEPENDENT_AMBULATORY_CARE_PROVIDER_SITE_OTHER): Payer: Managed Care, Other (non HMO) | Admitting: Cardiology

## 2011-08-10 ENCOUNTER — Encounter: Payer: Self-pay | Admitting: Cardiology

## 2011-08-10 VITALS — BP 122/62 | HR 65 | Ht 72.0 in | Wt 186.0 lb

## 2011-08-10 DIAGNOSIS — I2510 Atherosclerotic heart disease of native coronary artery without angina pectoris: Secondary | ICD-10-CM

## 2011-08-10 DIAGNOSIS — I4891 Unspecified atrial fibrillation: Secondary | ICD-10-CM

## 2011-08-10 DIAGNOSIS — E782 Mixed hyperlipidemia: Secondary | ICD-10-CM

## 2011-08-10 NOTE — Assessment & Plan Note (Signed)
He is at goal with very low numbers. His HDL is still below 40 but has always been. No change in treatment.

## 2011-08-10 NOTE — Progress Notes (Signed)
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 Other than some bruising, he replied his left ventricular systolic function, history of diabetes cured with lifestyle changes and weight loss, and history of atrial fib, hypertension and hyperlipidemia.  He's doing remarkably well with no angina or chest pain. He has had no clinical recurrence by history of A. Fib. Laboratory data from his primary care reviewed.  He is concerned about his platelet count. I have reviewed this and it is stable. He denies any bleeding other than some bruising.  Past Medical History  Diagnosis Date  . Atrial fibrillation     persistent  . Coronary artery disease 2006    s/p PCI x3   . Cerebrovascular disease   . Diabetes mellitus     type 2  . Morbid obesity     prior, has lost 120 lbs w/ lifestyle modification  . Hypertension   . Hyperlipidemia   . OSA (obstructive sleep apnea)     improved with weight loss    Current Outpatient Prescriptions  Medication Sig Dispense Refill  . aspirin 81 MG EC tablet Take 81 mg by mouth daily.        . carvedilol (COREG) 25 MG tablet Take 25 mg by mouth 2 (two) times daily.        Marland Kitchen lisinopril (PRINIVIL,ZESTRIL) 5 MG tablet TAKE ONE TABLET BY MOUTH EVERY DAY  30 tablet  6  . Multiple Vitamins-Iron (MULTIVITAMIN/IRON) TABS Take by mouth daily.        . nitroGLYCERIN (NITROSTAT) 0.4 MG SL tablet Place 0.4 mg under the tongue every 5 (five) minutes as needed. Take as needed for chest pain, up to three times       . Omega-3 Fatty Acids (FISH OIL) 1000 MG CAPS Take by mouth. 2 caps once daily        . PRADAXA 150 MG CAPS TAKE ONE CAPSULE BY MOUTH TWICE DAILY  60 capsule  10  . simvastatin (ZOCOR) 20 MG tablet TAKE ONE TABLET BY MOUTH EVERY DAY  30 tablet  6  . Tamsulosin HCl (FLOMAX) 0.4 MG CAPS Take by mouth  daily.          No Known Allergies  No family history on file.  History   Social History  . Marital Status: Married    Spouse Name: N/A    Number of Children: N/A  . Years of Education: N/A   Occupational History  . Not on file.   Social History Main Topics  . Smoking status: Never Smoker   . Smokeless tobacco: Never Used  . Alcohol Use: No  . Drug Use: No  . Sexually Active: Not on file   Other Topics Concern  . 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, wife manages [employer]    ROS ALL NEGATIVE EXCEPT THOSE NOTED IN HPI  PE  General Appearance: well developed, well nourished in no acute distress HEENT: symmetrical face, PERRLA, good dentition  Neck: no JVD, thyromegaly, or adenopathy, trachea midline Chest: symmetric without deformity Cardiac: PMI non-displaced, RRR, normal S1, S2, no gallop or murmur Lung: clear to ausculation and percussion Vascular: all pulses full without bruits  Abdominal: nondistended, nontender, good bowel sounds, no HSM, no bruits Extremities: no cyanosis, clubbing or edema, no sign  of DVT, no varicosities  Skin: normal color, no rashes Neuro: alert and oriented x 3, non-focal Pysch: normal affect  EKG Normal sinus rhythm, normal EKG BMET    Component Value Date/Time   NA 135 07/19/2010 1213   K 4.3 07/19/2010 1213   CL 100 07/19/2010 1213   CO2 29 07/19/2010 1213   GLUCOSE 86 07/19/2010 1213   BUN 20 07/19/2010 1213   CREATININE 1.0 07/19/2010 1213   CALCIUM 8.8 07/19/2010 1213   GFRNONAA 86.17 07/19/2010 1213   GFRAA  Value: >60        The eGFR has been calculated using the MDRD equation. This calculation has not been validated in all clinical situations. eGFR's persistently <60 mL/min signify possible Chronic Kidney Disease. 04/14/2010 2123    Lipid Panel     Component Value Date/Time   CHOL  Value: 109        ATP III CLASSIFICATION:  <200     mg/dL   Desirable   119-147  mg/dL   Borderline High  >=829    mg/dL   High        5/62/1308 0846   TRIG 32 04/15/2010 0846   HDL 39* 04/15/2010 0846   CHOLHDL 2.8 04/15/2010 0846   VLDL 6 04/15/2010 0846   LDLCALC  Value: 64        Total Cholesterol/HDL:CHD Risk Coronary Heart Disease Risk Table                     Men   Women  1/2 Average Risk   3.4   3.3  Average Risk       5.0   4.4  2 X Average Risk   9.6   7.1  3 X Average Risk  23.4   11.0        Use the calculated Patient Ratio above and the CHD Risk Table to determine the patient's CHD Risk.        ATP III CLASSIFICATION (LDL):  <100     mg/dL   Optimal  657-846  mg/dL   Near or Above                    Optimal  130-159  mg/dL   Borderline  962-952  mg/dL   High  >841     mg/dL   Very High 11/25/4008 2725    CBC    Component Value Date/Time   WBC 5.7 07/19/2010 1213   RBC 4.22 07/19/2010 1213   HGB 13.8 07/19/2010 1213   HCT 40.4 07/19/2010 1213   PLT 115.0* 07/19/2010 1213   MCV 95.8 07/19/2010 1213   MCH RESULTS UNAVAILABLE DUE TO INTERFERING SUBSTANCE 04/14/2010 2123   MCHC 34.2 07/19/2010 1213   RDW 13.6 07/19/2010 1213   LYMPHSABS 2.1 07/19/2010 1213   MONOABS 0.7 07/19/2010 1213   EOSABS 0.5 07/19/2010 1213   BASOSABS 0.0 07/19/2010 1213

## 2011-08-10 NOTE — Patient Instructions (Signed)
Your physician recommends that you schedule a follow-up appointment in: 6 Months  

## 2011-08-10 NOTE — Assessment & Plan Note (Signed)
He has had no recurrence of A. Fib. He has had 2 ablations and apparently is not a candidate for a third if he breaks through. We will hope that he does not.

## 2011-08-10 NOTE — Assessment & Plan Note (Signed)
He is doing remarkably well , secondary to being extremely compliant in taking great care to himself. He is a model for what patient's with previous risk factors need to improve their outcomes. I will see him back again in 6 months.

## 2011-10-06 ENCOUNTER — Other Ambulatory Visit: Payer: Self-pay | Admitting: Cardiology

## 2011-10-09 ENCOUNTER — Other Ambulatory Visit: Payer: Self-pay

## 2011-10-09 MED ORDER — CARVEDILOL 25 MG PO TABS: 25.0000 mg | ORAL_TABLET | Freq: Two times a day (BID) | ORAL | Status: DC

## 2012-02-19 ENCOUNTER — Other Ambulatory Visit: Payer: Self-pay | Admitting: Cardiology

## 2012-02-19 MED ORDER — DABIGATRAN ETEXILATE MESYLATE 150 MG PO CAPS: 150.0000 mg | ORAL_CAPSULE | Freq: Two times a day (BID) | ORAL | Status: DC

## 2012-02-19 NOTE — Telephone Encounter (Signed)
Out of pills please call when done 

## 2012-02-21 ENCOUNTER — Telehealth: Payer: Self-pay | Admitting: Cardiology

## 2012-02-21 ENCOUNTER — Ambulatory Visit (INDEPENDENT_AMBULATORY_CARE_PROVIDER_SITE_OTHER): Payer: Managed Care, Other (non HMO) | Admitting: Cardiology

## 2012-02-21 ENCOUNTER — Encounter: Payer: Self-pay | Admitting: Cardiology

## 2012-02-21 VITALS — BP 112/60 | HR 66 | Ht 73.0 in | Wt 187.0 lb

## 2012-02-21 DIAGNOSIS — I2510 Atherosclerotic heart disease of native coronary artery without angina pectoris: Secondary | ICD-10-CM

## 2012-02-21 DIAGNOSIS — E782 Mixed hyperlipidemia: Secondary | ICD-10-CM

## 2012-02-21 DIAGNOSIS — I4891 Unspecified atrial fibrillation: Secondary | ICD-10-CM

## 2012-02-21 DIAGNOSIS — I10 Essential (primary) hypertension: Secondary | ICD-10-CM

## 2012-02-21 NOTE — Telephone Encounter (Signed)
Patient was seen today but has questions about checkout notes. Please return call to patient on mobile [phone]

## 2012-02-21 NOTE — Patient Instructions (Addendum)
Your physician wants you to follow-up in: 6 months with Dr. Allred. You will receive a reminder letter in the mail two months in advance. If you don't receive a letter, please call our office to schedule the follow-up appointment.  

## 2012-02-21 NOTE — Telephone Encounter (Signed)
Answered pt's questions.

## 2012-02-21 NOTE — Progress Notes (Signed)
HPI [name] comes in today for evaluation and management of his coronary artery disease, history of MI, history of PCI, and history of atrial fibrillation.  He's had no angina or ischemic symptoms. He denies any symptoms of A. fib.  He's extremely compliant. Meds reviewed. He is following up with his primary care next week for his annual checkup and blood work.  Past Medical History  Diagnosis Date  . Atrial fibrillation     persistent  . Coronary artery disease 2006    s/p PCI x3   . Cerebrovascular disease   . Diabetes mellitus     type 2  . Morbid obesity     prior, has lost 120 lbs w/ lifestyle modification  . Hypertension   . Hyperlipidemia   . OSA (obstructive sleep apnea)     improved with weight loss    Current Outpatient Prescriptions  Medication Sig Dispense Refill  . aspirin 81 MG EC tablet Take 81 mg by mouth daily.        . carvedilol (COREG) 25 MG tablet Take 1 tablet (25 mg total) by mouth 2 (two) times daily.  60 tablet  6  . dabigatran (PRADAXA) 150 MG CAPS Take 150 mg by mouth every 12 (twelve) hours.      Marland Kitchen lisinopril (PRINIVIL,ZESTRIL) 5 MG tablet TAKE ONE TABLET BY MOUTH EVERY DAY  30 tablet  6  . Multiple Vitamins-Iron (MULTIVITAMIN/IRON) TABS Take by mouth daily.        Marland Kitchen NITROSTAT 0.4 MG SL tablet DISSOLVE ONE TABLET UNDER THE TONGUE EVERY 5 MINUTES AS NEEDED FOR CHEST PAIN.  DO NOT EXCEED A TOTAL OF 3 DOSES IN 15 MINUTES  25 each  4  . Omega-3 Fatty Acids (FISH OIL) 1000 MG CAPS Take by mouth. 2 caps once daily        . simvastatin (ZOCOR) 20 MG tablet TAKE ONE TABLET BY MOUTH EVERY DAY  30 tablet  6  . Tamsulosin HCl (FLOMAX) 0.4 MG CAPS Take by mouth daily.        Marland Kitchen DISCONTD: dabigatran (PRADAXA) 150 MG CAPS Take 1 capsule (150 mg total) by mouth 2 times daily at 12 noon and 4 pm.  60 capsule  10    No Known Allergies  No family history on file.  History   Social History  . Marital Status: Married    Spouse Name: N/A    Number of Children: N/A    . Years of Education: N/A   Occupational History  . Not on file.   Social History Main Topics  . Smoking status: Never Smoker   . Smokeless tobacco: Never Used  . Alcohol Use: No  . Drug Use: No  . Sexually Active: Not on file   Other Topics Concern  . 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, wife manages [employer]    ROS ALL NEGATIVE EXCEPT THOSE NOTED IN HPI  PE  General Appearance: well developed, well nourished in no acute distress HEENT: symmetrical face, PERRLA, good dentition  Neck: no JVD, thyromegaly, or adenopathy, trachea midline Chest: symmetric without deformity Cardiac: PMI non-displaced, RRR, normal S1, S2, no gallop or murmur Lung: clear to ausculation and percussion Vascular: all pulses full without bruits  Abdominal: nondistended, nontender, good bowel sounds, no HSM, no bruits Extremities: no cyanosis, clubbing or edema, no sign of DVT, no varicosities  Skin: normal color, no rashes Neuro: alert and oriented x 3, non-focal  Pysch: normal affect  EKG Normal sinus rhythm, old septal infarct, no change. BMET    Component Value Date/Time   NA 135 07/19/2010 1213   K 4.3 07/19/2010 1213   CL 100 07/19/2010 1213   CO2 29 07/19/2010 1213   GLUCOSE 86 07/19/2010 1213   BUN 20 07/19/2010 1213   CREATININE 1.0 07/19/2010 1213   CALCIUM 8.8 07/19/2010 1213   GFRNONAA 86.17 07/19/2010 1213   GFRAA  Value: >60        The eGFR has been calculated using the MDRD equation. This calculation has not been validated in all clinical situations. eGFR's persistently <60 mL/min signify possible Chronic Kidney Disease. 04/14/2010 2123    Lipid Panel     Component Value Date/Time   CHOL  Value: 109        ATP III CLASSIFICATION:  <200     mg/dL   Desirable  295-621  mg/dL   Borderline High  >=308    mg/dL   High        6/57/8469 0846   TRIG 32 04/15/2010 0846   HDL 39* 04/15/2010 0846   CHOLHDL 2.8  04/15/2010 0846   VLDL 6 04/15/2010 0846   LDLCALC  Value: 64        Total Cholesterol/HDL:CHD Risk Coronary Heart Disease Risk Table                     Men   Women  1/2 Average Risk   3.4   3.3  Average Risk       5.0   4.4  2 X Average Risk   9.6   7.1  3 X Average Risk  23.4   11.0        Use the calculated Patient Ratio above and the CHD Risk Table to determine the patient's CHD Risk.        ATP III CLASSIFICATION (LDL):  <100     mg/dL   Optimal  629-528  mg/dL   Near or Above                    Optimal  130-159  mg/dL   Borderline  413-244  mg/dL   High  >010     mg/dL   Very High 2/72/5366 4403    CBC    Component Value Date/Time   WBC 5.7 07/19/2010 1213   RBC 4.22 07/19/2010 1213   HGB 13.8 07/19/2010 1213   HCT 40.4 07/19/2010 1213   PLT 115.0* 07/19/2010 1213   MCV 95.8 07/19/2010 1213   MCH RESULTS UNAVAILABLE DUE TO INTERFERING SUBSTANCE 04/14/2010 2123   MCHC 34.2 07/19/2010 1213   RDW 13.6 07/19/2010 1213   LYMPHSABS 2.1 07/19/2010 1213   MONOABS 0.7 07/19/2010 1213   EOSABS 0.5 07/19/2010 1213   BASOSABS 0.0 07/19/2010 1213

## 2012-02-21 NOTE — Assessment & Plan Note (Signed)
He is doing remarkably well. I'll have him followup with Dr. [name] in 6 months. No change in medications.

## 2012-02-29 ENCOUNTER — Encounter: Payer: Self-pay | Admitting: Cardiology

## 2012-03-15 ENCOUNTER — Other Ambulatory Visit: Payer: Self-pay | Admitting: Cardiology

## 2012-03-29 ENCOUNTER — Encounter (HOSPITAL_COMMUNITY): Payer: Self-pay

## 2012-03-29 ENCOUNTER — Emergency Department (HOSPITAL_COMMUNITY)
Admission: EM | Admit: 2012-03-29 | Discharge: 2012-03-29 | Disposition: A | Discharge status: Home / Self Care | Payer: Managed Care, Other (non HMO) | Attending: Emergency Medicine | Admitting: Emergency Medicine

## 2012-03-29 ENCOUNTER — Emergency Department (HOSPITAL_COMMUNITY): Payer: Managed Care, Other (non HMO)

## 2012-03-29 DIAGNOSIS — Z8673 Personal history of transient ischemic attack (TIA), and cerebral infarction without residual deficits: Secondary | ICD-10-CM | POA: Insufficient documentation

## 2012-03-29 DIAGNOSIS — E119 Type 2 diabetes mellitus without complications: Secondary | ICD-10-CM | POA: Insufficient documentation

## 2012-03-29 DIAGNOSIS — I4891 Unspecified atrial fibrillation: Secondary | ICD-10-CM | POA: Insufficient documentation

## 2012-03-29 DIAGNOSIS — I10 Essential (primary) hypertension: Secondary | ICD-10-CM | POA: Insufficient documentation

## 2012-03-29 DIAGNOSIS — Z7982 Long term (current) use of aspirin: Secondary | ICD-10-CM | POA: Insufficient documentation

## 2012-03-29 DIAGNOSIS — E785 Hyperlipidemia, unspecified: Secondary | ICD-10-CM | POA: Insufficient documentation

## 2012-03-29 DIAGNOSIS — R079 Chest pain, unspecified: Secondary | ICD-10-CM

## 2012-03-29 DIAGNOSIS — Z79899 Other long term (current) drug therapy: Secondary | ICD-10-CM | POA: Insufficient documentation

## 2012-03-29 DIAGNOSIS — I2510 Atherosclerotic heart disease of native coronary artery without angina pectoris: Secondary | ICD-10-CM | POA: Insufficient documentation

## 2012-03-29 LAB — CBC WITH DIFFERENTIAL/PLATELET
Basophils Absolute: 0 10*3/uL (ref 0.0–0.1)
Basophils Relative: 1 % (ref 0–1)
HCT: 39.5 % (ref 39.0–52.0)
Lymphocytes Relative: 32 % (ref 12–46)
MCHC: 37 g/dL — ABNORMAL HIGH (ref 30.0–36.0)
Neutro Abs: 2.4 10*3/uL (ref 1.7–7.7)
Neutrophils Relative %: 44 % (ref 43–77)
RDW: 12.7 % (ref 11.5–15.5)
WBC: 5.5 10*3/uL (ref 4.0–10.5)

## 2012-03-29 LAB — BASIC METABOLIC PANEL
BUN: 18 mg/dL (ref 6–23)
Chloride: 103 mEq/L (ref 96–112)
Creatinine, Ser: 0.76 mg/dL (ref 0.50–1.35)
GFR calc Af Amer: >90 mL/min (ref >90)
GFR calc non Af Amer: >90 mL/min (ref >90)
Potassium: 4.3 mEq/L (ref 3.5–5.1)

## 2012-03-29 LAB — URINALYSIS, ROUTINE W REFLEX MICROSCOPIC
Bilirubin Urine: NEGATIVE
Hgb urine dipstick: NEGATIVE
Nitrite: NEGATIVE
Protein, ur: NEGATIVE mg/dL
Specific Gravity, Urine: 1.006 (ref 1.005–1.030)
Urobilinogen, UA: 0.2 mg/dL (ref 0.0–1.0)

## 2012-03-29 LAB — TROPONIN I
Troponin I: <0.3 ng/mL (ref <0.30)
Troponin I: <0.3 ng/mL (ref <0.30)

## 2012-03-29 NOTE — ED Provider Notes (Signed)
History     CSN: [id number]  Arrival date & time 03/29/12  1253   First MD Initiated Contact with Patient 03/29/12 1336      Chief Complaint  Patient presents with  . Chest Pain    (Consider location/radiation/quality/duration/timing/severity/associated sxs/prior treatment) HPI History from patient. 51 year old male with past medical history of atrial fibrillation status post several ablation procedures and CAD status post PCI x3 presents with chest pain. He states that this started while he was at work this morning around 10:00 AM. He was sitting at his desk at the time. Pain is described as sharp and squeezing and was located substernally. He did not have any associated shortness of breath, nausea, vomiting, diaphoresis, palpitations with this. There was no radiation of this pain this morning. He states this was in contrast to the MI that he had in 2006 which was associated with radiating pain down his arm with diaphoresis, nausea, and vomiting. Patient took 3 of his own nitroglycerin tablets prior to calling EMS. He states that this seemed to help the pain but was not completely relieved. Pain at its max was 5/10. On the way to the ED, the patient was given aspirin and one additional nitroglycerin with improvement of his symptoms. Patient currently states that he is having some slight discomfort which he rates as less than 1/10.  Primary cardiologist: Dr. Daleen Squibb (last saw a month ago) EP M.D.: Dr. [name]  Past Medical History  Diagnosis Date  . Atrial fibrillation     persistent  . Coronary artery disease 2006    s/p PCI x3   . Cerebrovascular disease   . Diabetes mellitus     type 2  . Morbid obesity     prior, has lost 120 lbs w/ lifestyle modification  . Hypertension   . Hyperlipidemia   . OSA (obstructive sleep apnea)     improved with weight loss    Past Surgical History  Procedure Date  . Eye surgery     at 75 months of age  . Atrial ablation surgery 02/08/09 and  07/21/10    History reviewed. No pertinent family history.  History  Substance Use Topics  . Smoking status: Never Smoker   . Smokeless tobacco: Never Used  . Alcohol Use: No      Review of Systems  Constitutional: Negative for fever and chills.  HENT: Negative for sore throat and neck pain.   Respiratory: Negative for cough, chest tightness, shortness of breath and wheezing.   Cardiovascular: Positive for chest pain. Negative for palpitations and leg swelling.  Gastrointestinal: Negative for nausea, vomiting and abdominal pain.  Musculoskeletal: Negative for myalgias.  Skin: Negative for color change and rash.  Neurological: Negative for dizziness and weakness.  All other systems reviewed and are negative.    Allergies  Review of patient's allergies indicates no known allergies.  Home Medications   Current Outpatient Rx  Name Route Sig Dispense Refill  . ASPIRIN 81 MG PO TBEC Oral Take 81 mg by mouth daily.      Marland Kitchen CARVEDILOL 25 MG PO TABS Oral Take 1 tablet (25 mg total) by mouth 2 (two) times daily. 60 tablet 6  . DABIGATRAN ETEXILATE MESYLATE 150 MG PO CAPS Oral Take 150 mg by mouth every 12 (twelve) hours.    Marland Kitchen LISINOPRIL 5 MG PO TABS  TAKE ONE TABLET BY MOUTH EVERY DAY 30 tablet 9  . MULTIVITAMIN/IRON PO TABS Oral Take by mouth daily.      Marland Kitchen  NITROSTAT 0.4 MG SL SUBL  DISSOLVE ONE TABLET UNDER THE TONGUE EVERY 5 MINUTES AS NEEDED FOR CHEST PAIN.  DO NOT EXCEED A TOTAL OF 3 DOSES IN 15 MINUTES 25 each 4  . FISH OIL 1000 MG PO CAPS Oral Take by mouth. 2 caps once daily      . SIMVASTATIN 20 MG PO TABS  TAKE ONE TABLET BY MOUTH EVERY DAY 30 tablet 6  . TAMSULOSIN HCL 0.4 MG PO CAPS Oral Take by mouth daily.        BP 117/73  Pulse 67  Temp 97.9 F (36.6 C) (Oral)  Resp 14  SpO2 100%  Physical Exam  Nursing note and vitals reviewed. Constitutional: He appears well-developed and well-nourished. No distress.  HENT:  Head: Normocephalic and atraumatic.  Eyes:        Normal appearance  Neck: Normal range of motion.  Cardiovascular: Normal rate, regular rhythm and normal heart sounds.  Exam reveals no gallop and no friction rub.   No murmur heard. Pulmonary/Chest: Effort normal and breath sounds normal. He exhibits no tenderness.  Abdominal: Soft. Bowel sounds are normal. There is no tenderness. There is no rebound and no guarding.  Musculoskeletal: Normal range of motion. He exhibits no edema.       No evidence of peripheral edema  Neurological: He is alert.  Skin: Skin is warm and dry. He is not diaphoretic.  Psychiatric: He has a normal mood and affect.    ED Course  Procedures (including critical care time)   Date: 03/29/2012  Rate: 67  Rhythm: normal sinus rhythm  QRS Axis: normal  Intervals: normal  ST/T Wave abnormalities: cannot r/o age indeterminate anteroseptal infarct  Conduction Disutrbances:none  Narrative Interpretation:   Old EKG Reviewed: as compared with Jul 22 2010 no sig changes  Labs Reviewed  CBC WITH DIFFERENTIAL - Abnormal; Notable for the following:    MCHC 37.0 (*)     Platelets 125 (*)     Monocytes Relative 13 (*)     Eosinophils Relative 11 (*)     All other components within normal limits  TROPONIN I  BASIC METABOLIC PANEL   Dg Chest 2 View  03/29/2012  *RADIOLOGY REPORT*  Clinical Data: Chest pain  CHEST - 2 VIEW  Comparison: 01/07/2010  Findings: Cardiomediastinal silhouette is stable.  No acute infiltrate or pleural effusion.  No pulmonary edema.  Mild degenerative changes thoracic spine.  IMPRESSION: No active disease.  No significant change.  Original Report Authenticated By: Natasha Mead, M.D.     No diagnosis found.    MDM  Pt with episode of nonexertional CP while at work today. Hx afib, CAD. No sig changes as compared with old ECG, negative troponin x 1. Currently pain free. Discussed with Trish with [facility] - they will consult.        Grant Fontana, PA-C 03/29/12 1547

## 2012-03-29 NOTE — ED Notes (Signed)
No chest pain family at the bedside

## 2012-03-29 NOTE — ED Notes (Signed)
Pt. Was at work and began having chest pain located in the center of his chest.  Described as non-radiating, he reports having mild sob.   Pt. Received 3 nitro which at present time his pain is 3/10 and he describes it as tightness.  Pt. Denies any n/v or dizziness.  Skin is warm, dry and  Pink,  Resp. E/u

## 2012-03-29 NOTE — ED Notes (Signed)
The pt wants to know what is happening with him.  He wants to now if he stays or goes.  edp given message

## 2012-03-29 NOTE — Consult Note (Signed)
CARDIOLOGY CONSULT NOTE  Patient ID: [name] MRN: [id number] DOB/AGE: 51-Dec-1962 51 y.o.  Admit date: 03/29/2012 Referring Physician: Dr Radford Pax Primary Physician: Cassie Freer, MD Primary Cardiologist: Dr Daleen Squibb Reason for Consultation: Chest pain  HPI: This is a 51 year old gentleman with known coronary artery disease. He underwent PCI in 2006 when he presented with an acute MI. The patient has done well and he has been extremely compliant with his medical program, dietary restriction, and exercise. In fact he previously has had morbid obesity he has lost well over 100 pounds over several years.  The patient was at work today sitting at a desk when he developed substernal pressure and "tingling. "He felt like he probably had indigestion at first, but then was uncertain and thought he may have had "angina." He took 3 nitroglycerin and this did not change his pain. The pain was nonradiating. He did not have nausea, vomiting, diaphoresis, shortness of breath, lightheadedness, or syncope. As the pain persisted, he called EMS and was brought to the emergency department where his symptoms subsequently resolved. The total duration of the episode was about 3 hours. He now feels back to his normal state of health and has no complaints.  The patient exercised as recently as yesterday on the treadmill and he had no symptoms with exertion. He's had no recent changes in his medications.  Past Medical History  Diagnosis Date  . Atrial fibrillation     persistent  . Coronary artery disease 2006    s/p PCI x3   . Cerebrovascular disease   . Diabetes mellitus     type 2  . Morbid obesity     prior, has lost 120 lbs w/ lifestyle modification  . Hypertension   . Hyperlipidemia   . OSA (obstructive sleep apnea)     improved with weight loss     Past Surgical History  Procedure Date  . Eye surgery     at 21 months of age  . Atrial ablation surgery 02/08/09 and 07/21/10     History  reviewed. No pertinent family history.  Family history: The patient's father died at age 67. He had a history of alcohol and there was no autopsy done to determine cause of death. There is no history of documented premature CAD in the family.  Social History: History   Social History  . Marital Status: Married    Spouse Name: N/A    Number of Children: N/A  . Years of Education: N/A   Occupational History  . Not on file.   Social History Main Topics  . Smoking status: Never Smoker   . Smokeless tobacco: Never Used  . Alcohol Use: No  . Drug Use: No  . Sexually Active: Not on file   Other Topics Concern  . 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, wife manages [employer]    Medications: Aspirin 81 mg daily Multivitamin 1 daily Fish oil 2 capsules daily Flomax 0.4 mg at bedtime Carvedilol 25 mg twice daily Pradaxa 150 mg twice daily Lisinopril 5 mg daily Simvastatin 20 mg at bedtime  ROS: General: no fevers/chills/night sweats Eyes: no blurry vision, diplopia, or amaurosis ENT: no sore throat or hearing loss Resp: no cough, wheezing, or hemoptysis CV: no edema or palpitations GI: no abdominal pain, nausea, vomiting, diarrhea, or constipation GU: no dysuria, frequency, or hematuria Skin: no rash Neuro: no headache, numbness, tingling, or weakness of extremities Musculoskeletal: no  joint pain or swelling Heme: no bleeding, DVT, or easy bruising Endo: no polydipsia or polyuria   Physical Exam: Blood pressure 110/67, pulse 62, temperature 97.9 F (36.6 C), temperature source Oral, resp. rate 20, SpO2 100.00%.  Pt is alert and oriented, WD, WN, in no distress. HEENT: normal Neck: JVP normal. Carotid upstrokes normal without bruits. No thyromegaly. Lungs: equal expansion, clear bilaterally CV: Apex is discrete and nondisplaced, RRR without murmur or gallop Abd: soft, NT, +BS, no bruit, no  hepatosplenomegaly Back: no CVA tenderness Ext: no C/C/E        Femoral pulses 2+= without bruits        DP/PT pulses intact and = Skin: warm and dry without rash Neuro: CNII-XII intact             Strength intact = bilaterally   Labs:   Lab Results  Component Value Date   WBC 5.5 03/29/2012   HGB 14.6 03/29/2012   HCT 39.5 03/29/2012   MCV 86.6 03/29/2012   PLT 125* 03/29/2012    Lab 03/29/12 1430  NA 138  K 4.3  CL 103  CO2 30  BUN 18  CREATININE 0.76  CALCIUM 9.3  PROT --  BILITOT --  ALKPHOS --  ALT --  AST --  GLUCOSE 90   Lab Results  Component Value Date   CKTOTAL 57 04/15/2010   CKMB 1.2 04/15/2010   TROPONINI <0.30 03/29/2012    Lab Results  Component Value Date   CHOL  Value: 109        ATP III CLASSIFICATION:  <200     mg/dL   Desirable  119-147  mg/dL   Borderline High  >=829    mg/dL   High        5/62/1308   CHOL  Value: 98        ATP III CLASSIFICATION:  <200     mg/dL   Desirable  657-846  mg/dL   Borderline High  >=962    mg/dL   High        9/52/8413   CHOL  Value: 90        ATP III CLASSIFICATION:  <200     mg/dL   Desirable  244-010  mg/dL   Borderline High  >=272    mg/dL   High 5/36/6440   Lab Results  Component Value Date   HDL 39* 04/15/2010   HDL 31* 12/29/2009   HDL 32* 02/14/2008   Lab Results  Component Value Date   LDLCALC  Value: 64        Total Cholesterol/HDL:CHD Risk Coronary Heart Disease Risk Table                     Men   Women  1/2 Average Risk   3.4   3.3  Average Risk       5.0   4.4  2 X Average Risk   9.6   7.1  3 X Average Risk  23.4   11.0        Use the calculated Patient Ratio above and the CHD Risk Table to determine the patient's CHD Risk.        ATP III CLASSIFICATION (LDL):  <100     mg/dL   Optimal  347-425  mg/dL   Near or Above                    Optimal  130-159  mg/dL  Borderline  160-189  mg/dL   High  >161     mg/dL   Very High 0/96/0454   LDLCALC  Value: 48        Total Cholesterol/HDL:CHD Risk Coronary Heart  Disease Risk Table                     Men   Women  1/2 Average Risk   3.4   3.3  Average Risk       5.0   4.4  2 X Average Risk   9.6   7.1  3 X Average Risk  23.4   11.0        Use the calculated Patient Ratio above and the CHD Risk Table to determine the patient's CHD Risk.        ATP III CLASSIFICATION (LDL):  <100     mg/dL   Optimal  098-119  mg/dL   Near or Above                    Optimal  130-159  mg/dL   Borderline  147-829  mg/dL   High  >562     mg/dL   Very High 10/04/8655   LDLCALC  Value: 51        Total Cholesterol/HDL:CHD Risk Coronary Heart Disease Risk Table                     Men   Women  1/2 Average Risk   3.4   3.3 02/14/2008   Lab Results  Component Value Date   TRIG 32 04/15/2010   TRIG 95 12/29/2009   TRIG 33 02/14/2008   Lab Results  Component Value Date   CHOLHDL 2.8 04/15/2010   CHOLHDL 3.2 12/29/2009   CHOLHDL 2.8 02/14/2008   No results found for this basename: LDLDIRECT      Radiology: Dg Chest 2 View  03/29/2012  *RADIOLOGY REPORT*  Clinical Data: Chest pain  CHEST - 2 VIEW  Comparison: 01/07/2010  Findings: Cardiomediastinal silhouette is stable.  No acute infiltrate or pleural effusion.  No pulmonary edema.  Mild degenerative changes thoracic spine.  IMPRESSION: No active disease.  No significant change.  Original Report Authenticated By: Natasha Mead, M.D.    EKG: Normal sinus rhythm 67 beats per minute, cannot rule out age undetermined anteroseptal infarct. No acute ST or T wave changes to  ASSESSMENT AND PLAN:  1. The chest pain at rest. His symptoms are somewhat atypical and by his report they are unlike those of his myocardial infarction. His EKG shows no acute changes and his initial cardiac markers are negative. He is now pain-free and appears well. He has just had a second set of cardiac enzymes drawn. I think if these are negative he can be discharged from the emergency department at low risk of a subsequent event. I would recommend an exercise stress  Myoview next week in the office. I did discuss with the patient at length the importance of very close followup and he understands this. If he develops recurrent chest pain refractory to nitroglycerin he will call 911. He will otherwise continue on his home medical program.  2. Paroxysmal atrial fibrillation. The patient is in sinus rhythm today and he remains anticoagulated with dabigatran.  3. Hypertension. Well controlled on current medical program.  Tonny Bollman 03/29/2012, 5:55 PM

## 2012-03-29 NOTE — ED Provider Notes (Signed)
Medical screening examination/treatment/procedure(s) were conducted as a shared visit with non-physician practitioner(s) and myself.  I personally evaluated the patient during the encounter  Pt seen by me with Grant Fontana, PA-C  [name] is a 51 y.o. male with non-exertional CP that improved and resolved after SL NTG by pt and EMS. Also treated with ASA. He had a Cardiac angiogram done at cath in 2011, that showed, stable patent stents. He does not use NTG regularly. On exam, he is calm and comfortable.   Plan: Consult Cardiology to eval. Patient and arrange disposition.   Note: Dr Excell Seltzer (Cardiology) signed the ED note by Grant Fontana, PA.    Flint Melter, MD 03/29/12 [id number]

## 2012-04-01 ENCOUNTER — Telehealth: Payer: Self-pay | Admitting: Cardiology

## 2012-04-01 ENCOUNTER — Telehealth: Payer: Self-pay | Admitting: *Deleted

## 2012-04-01 DIAGNOSIS — R079 Chest pain, unspecified: Secondary | ICD-10-CM

## 2012-04-01 NOTE — Telephone Encounter (Signed)
New problem:  Patient was seen in emergency room on 7/26. Stated he need a stress test .

## 2012-04-03 ENCOUNTER — Ambulatory Visit (HOSPITAL_COMMUNITY): Payer: Managed Care, Other (non HMO) | Attending: Cardiology | Admitting: Radiology

## 2012-04-03 VITALS — BP 114/77 | Ht 72.0 in | Wt 183.0 lb

## 2012-04-03 DIAGNOSIS — I10 Essential (primary) hypertension: Secondary | ICD-10-CM | POA: Insufficient documentation

## 2012-04-03 DIAGNOSIS — I2510 Atherosclerotic heart disease of native coronary artery without angina pectoris: Secondary | ICD-10-CM

## 2012-04-03 DIAGNOSIS — R079 Chest pain, unspecified: Secondary | ICD-10-CM

## 2012-04-03 DIAGNOSIS — I4891 Unspecified atrial fibrillation: Secondary | ICD-10-CM

## 2012-04-03 DIAGNOSIS — E782 Mixed hyperlipidemia: Secondary | ICD-10-CM | POA: Insufficient documentation

## 2012-04-03 MED ORDER — TECHNETIUM TC 99M TETROFOSMIN IV KIT
10.0000 | PACK | Freq: Once | INTRAVENOUS | Status: AC | PRN
  Administered 2012-04-03: 10 via INTRAVENOUS

## 2012-04-03 MED ORDER — TECHNETIUM TC 99M TETROFOSMIN IV KIT
30.0000 | PACK | Freq: Once | INTRAVENOUS | Status: AC | PRN
  Administered 2012-04-03: 30 via INTRAVENOUS

## 2012-04-03 NOTE — Progress Notes (Signed)
[facility] SITE 3 NUCLEAR MED [address] [place] [address] [phone]  Cardiology Nuclear Med Study  [name] is a 51 y.o. male     MRN : [id number]     DOB: [birth year]  Procedure Date: 04/03/2012  Nuclear Med Background Indication for Stress Test:  Evaluation for Ischemia, Stent Patency, PTCA Patency and 03/29/12 Post Hospital: Chest Pain, (-) enzymes History:  Abnormal EKG, AFIB, 8/06 AWMI: Stents LADx3 9/06 Heart Cath: EF: 45% Tx Rx Patent LAD stents jailed Dx 80% Dx2 99% (unchanged from prior cath) 2/07 MPS: Low risk with small distal anterior infarct and mild peri-infarct ischemia  02/08/09 Ablation, 2011 ECHO: NL 07/21/10 Ablation Bipolar Disorder  Cardiac Risk Factors: Hypertension, Lipids and NIDDM  Symptoms:  Chest Pain   Nuclear Pre-Procedure Caffeine/Decaff Intake:  None NPO After: 8:30pm   Lungs:  clear O2 Sat: 98% on room air. IV 0.9% NS with Angio Cath:  20g  IV Site: R Antecubital  IV Started by:  Stanton Kidney, EMT-P  Chest Size (in):  42 Cup Size: n/a  Height: 6' (1.829 m)  Weight:  183 lb (83.008 kg)  BMI:  Body mass index is 24.82 kg/(m^2). Tech Comments:  Coreg held > 9 hours, per patient.  Pictures checked, without changes.    Nuclear Med Study 1 or 2 day study: 1 day  Stress Test Type:  Stress  Reading MD: Charlton Haws, MD  Order Authorizing Provider:  Valera Castle MD  Resting Radionuclide: Technetium 4m Tetrofosmin  Resting Radionuclide Dose: 11.0 mCi   Stress Radionuclide:  Technetium 14m Tetrofosmin  Stress Radionuclide Dose: 33.0 mCi           Stress Protocol Rest HR: 78 Stress HR: 157  Rest BP: 114/77 Stress BP: 153/73  Exercise Time (min): 12:30 METS: 14.30   Predicted Max HR: 169 bpm % Max HR: 92.9 bpm Rate Pressure Product: 21308   Dose of Adenosine (mg):  n/a Dose of Lexiscan: n/a mg  Dose of Atropine (mg): n/a Dose of Dobutamine: n/a mcg/kg/min (at max HR)  Stress Test Technologist: Milana Na, EMT-P   Nuclear Technologist:  Domenic Polite, CNMT     Rest Procedure:  Myocardial perfusion imaging was performed at rest 45 minutes following the intravenous administration of Technetium 12m Tetrofosmin. Rest ECG: NSR - Normal EKG  Stress Procedure:  The patient performed treadmill exercise using a Bruce  Protocol for 12:30 minutes. The patient stopped due to fatigue and denied any chest pain.  There were no significant ST-T wave changes. The patient had Blunted BP's in recovery. Technetium 61m Tetrofosmin was injected at peak exercise and myocardial perfusion imaging was performed after a brief delay. Stress ECG: No significant change from baseline ECG  QPS Raw Data Images:  Patient motion noted. Stress Images:  distal anterior wall/apical attenuation Rest Images:  Distal anterior wall/apical attenuation Subtraction (SDS):  SDS 3 abnormal at apex Transient Ischemic Dilatation (Normal <1.22):  0.91 Lung/Heart Ratio (Normal <0.45):  0.32  Quantitative Gated Spect Images QGS EDV:  134 ml QGS ESV:  58 ml  Impression Exercise Capacity:  Excellent exercise capacity. BP Response:  Normal blood pressure response. Clinical Symptoms:  No significant symptoms noted. ECG Impression:  No significant ST segment change suggestive of ischemia. Comparison with Prior Nuclear Study: No images to compare  Overall Impression:  Low risk stress nuclear study. Possible small distal anterior wall / apical infarct no ischemia  LV Ejection Fraction: 56%.  LV Wall Motion:  NL LV Function; NL Wall Motion    Charlton Haws

## 2012-04-04 ENCOUNTER — Encounter (HOSPITAL_COMMUNITY): Payer: Managed Care, Other (non HMO)

## 2012-04-04 NOTE — Telephone Encounter (Signed)
04/04/12 Stress test was schedule on 04/03/12 at 8:45am.

## 2012-04-15 ENCOUNTER — Other Ambulatory Visit: Payer: Self-pay | Admitting: Cardiology

## 2012-04-17 ENCOUNTER — Encounter: Payer: Managed Care, Other (non HMO) | Admitting: Nurse Practitioner

## 2012-04-19 ENCOUNTER — Telehealth: Payer: Self-pay | Admitting: *Deleted

## 2012-04-19 NOTE — Telephone Encounter (Signed)
LMOVM myoview results.  Mylo Red RN

## 2012-05-20 ENCOUNTER — Other Ambulatory Visit: Payer: Self-pay | Admitting: Internal Medicine

## 2012-05-20 MED ORDER — CARVEDILOL 25 MG PO TABS: 25.0000 mg | ORAL_TABLET | Freq: Two times a day (BID) | ORAL | Status: DC

## 2012-06-07 ENCOUNTER — Telehealth: Payer: Self-pay | Admitting: Cardiology

## 2012-06-07 NOTE — Telephone Encounter (Signed)
Pt calls b/c he is not sure if he needs an appointment with Dr. [name] in December. He thought Dr. Daleen Squibb had told him he needed to see him back in 6 months not Dr. [name].  Pt has appt with Dr. Daleen Squibb on 08/07/12 Will forward to Dr. Daleen Squibb for review. Mylo Red RN

## 2012-06-07 NOTE — Telephone Encounter (Signed)
plz return call to patient on cell [phone] regarding f/u w/[facility]  Patient thought he had been cleared w/[facility] after Ablation and wonders if he needs f/u with [facility] going forward for 12./13 recall

## 2012-06-11 NOTE — Telephone Encounter (Signed)
Agree he needs to see me.

## 2012-06-11 NOTE — Telephone Encounter (Signed)
**Note De-Identified [name] Obfuscation** Left detailed message on VM./LV

## 2012-06-13 NOTE — Telephone Encounter (Signed)
Pt aware of appointment. Appt with Dr. [name] cancelled. Mylo Red RN

## 2012-08-07 ENCOUNTER — Encounter: Payer: Self-pay | Admitting: Cardiology

## 2012-08-07 ENCOUNTER — Ambulatory Visit (INDEPENDENT_AMBULATORY_CARE_PROVIDER_SITE_OTHER): Payer: Managed Care, Other (non HMO) | Admitting: Cardiology

## 2012-08-07 VITALS — BP 116/78 | HR 80 | Ht 72.0 in | Wt 194.0 lb

## 2012-08-07 DIAGNOSIS — I2510 Atherosclerotic heart disease of native coronary artery without angina pectoris: Secondary | ICD-10-CM

## 2012-08-07 DIAGNOSIS — E782 Mixed hyperlipidemia: Secondary | ICD-10-CM

## 2012-08-07 DIAGNOSIS — I4891 Unspecified atrial fibrillation: Secondary | ICD-10-CM

## 2012-08-07 MED ORDER — CARVEDILOL 25 MG PO TABS: 25.0000 mg | ORAL_TABLET | Freq: Two times a day (BID) | ORAL | Status: DC

## 2012-08-07 NOTE — Patient Instructions (Addendum)
Your physician wants you to follow-up in:6 months with Dr Daleen Squibb. You will receive a reminder letter in the mail two months in advance. If you don't receive a letter, please call our office to schedule the follow-up appointment.  Your physician recommends that you continue on your current medications as directed. Please refer to the Current Medication list given to you today.

## 2012-08-07 NOTE — Progress Notes (Signed)
HPI [name] returns today for a waste management of his history of coronary artery disease, history of MI, good LV recovery, paroxysmal A. fib status post ablation, hypertension and hyperlipidemia.  He continues to be remarkably health-conscious following his diet, exercising, and taking his meds. He has no complaints today of angina or chest discomfort. His labs are being followed by primary care and at goal.  Past Medical History  Diagnosis Date  . Atrial fibrillation     persistent  . Coronary artery disease 2006    s/p PCI x3   . Cerebrovascular disease   . Diabetes mellitus     type 2  . Morbid obesity     prior, has lost 120 lbs w/ lifestyle modification  . Hypertension   . Hyperlipidemia   . OSA (obstructive sleep apnea)     improved with weight loss    Current Outpatient Prescriptions  Medication Sig Dispense Refill  . aspirin 81 MG EC tablet Take 81 mg by mouth daily.        . carvedilol (COREG) 25 MG tablet Take 1 tablet (25 mg total) by mouth 2 (two) times daily.  60 tablet  11  . dabigatran (PRADAXA) 150 MG CAPS Take 150 mg by mouth every 12 (twelve) hours.      Marland Kitchen lisinopril (PRINIVIL,ZESTRIL) 5 MG tablet Take 5 mg by mouth daily.      . Multiple Vitamins-Iron (MULTIVITAMIN/IRON) TABS Take 1 tablet by mouth daily.       . nitroGLYCERIN (NITROSTAT) 0.4 MG SL tablet Place 0.4 mg under the tongue every 5 (five) minutes as needed. For chest pain      . Omega-3 Fatty Acids (FISH OIL) 1000 MG CAPS Take 2 capsules by mouth daily.       . simvastatin (ZOCOR) 20 MG tablet TAKE ONE TABLET BY MOUTH EVERY DAY  30 tablet  5  . Tamsulosin HCl (FLOMAX) 0.4 MG CAPS Take 0.4 mg by mouth at bedtime.       . [DISCONTINUED] carvedilol (COREG) 25 MG tablet Take 1 tablet (25 mg total) by mouth 2 (two) times daily.  60 tablet  5  . [DISCONTINUED] simvastatin (ZOCOR) 20 MG tablet Take 20 mg by mouth at bedtime.        No Known Allergies  No family history on file.  History   Social  History  . Marital Status: Married    Spouse Name: N/A    Number of Children: N/A  . Years of Education: N/A   Occupational History  . Not on file.   Social History Main Topics  . Smoking status: Never Smoker   . Smokeless tobacco: Never Used  . Alcohol Use: No  . Drug Use: No  . Sexually Active: Not on file   Other Topics Concern  . 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, wife manages [employer]    ROS ALL NEGATIVE EXCEPT THOSE NOTED IN HPI  PE  General Appearance: well developed, well nourished in no acute distress HEENT: symmetrical face, PERRLA, good dentition  Neck: no JVD, thyromegaly, or adenopathy, trachea midline Chest: symmetric without deformity Cardiac: PMI non-displaced, RRR, normal S1, S2, no gallop or murmur Lung: clear to ausculation and percussion Vascular: all pulses full without bruits  Abdominal: nondistended, nontender, good bowel sounds, no HSM, no bruits Extremities: no cyanosis, clubbing or edema, no sign of DVT, no varicosities  Skin: normal color, no rashes Neuro: alert and  oriented x 3, non-focal Pysch: normal affect  EKG  BMET    Component Value Date/Time   NA 138 03/29/2012 1430   K 4.3 03/29/2012 1430   CL 103 03/29/2012 1430   CO2 30 03/29/2012 1430   GLUCOSE 90 03/29/2012 1430   BUN 18 03/29/2012 1430   CREATININE 0.76 03/29/2012 1430   CALCIUM 9.3 03/29/2012 1430   GFRNONAA >90 03/29/2012 1430   GFRAA >90 03/29/2012 1430    Lipid Panel     Component Value Date/Time   CHOL  Value: 109        ATP III CLASSIFICATION:  <200     mg/dL   Desirable  098-119  mg/dL   Borderline High  >=147    mg/dL   High        05/02/5620 0846   TRIG 32 04/15/2010 0846   HDL 39* 04/15/2010 0846   CHOLHDL 2.8 04/15/2010 0846   VLDL 6 04/15/2010 0846   LDLCALC  Value: 64        Total Cholesterol/HDL:CHD Risk Coronary Heart Disease Risk Table                     Men   Women  1/2 Average Risk   3.4    3.3  Average Risk       5.0   4.4  2 X Average Risk   9.6   7.1  3 X Average Risk  23.4   11.0        Use the calculated Patient Ratio above and the CHD Risk Table to determine the patient's CHD Risk.        ATP III CLASSIFICATION (LDL):  <100     mg/dL   Optimal  308-657  mg/dL   Near or Above                    Optimal  130-159  mg/dL   Borderline  846-962  mg/dL   High  >952     mg/dL   Very High 8/41/3244 0102    CBC    Component Value Date/Time   WBC 5.5 03/29/2012 1430   RBC 4.56 03/29/2012 1430   HGB 14.6 03/29/2012 1430   HCT 39.5 03/29/2012 1430   PLT 125* 03/29/2012 1430   MCV 86.6 03/29/2012 1430   MCH 32.0 03/29/2012 1430   MCHC 37.0* 03/29/2012 1430   RDW 12.7 03/29/2012 1430   LYMPHSABS 1.7 03/29/2012 1430   MONOABS 0.7 03/29/2012 1430   EOSABS 0.6 03/29/2012 1430   BASOSABS 0.0 03/29/2012 1430

## 2012-08-07 NOTE — Assessment & Plan Note (Signed)
Stable. Continue secondary preventative therapy. Return the office in 6 months for followup.

## 2012-08-14 ENCOUNTER — Ambulatory Visit: Payer: Managed Care, Other (non HMO) | Admitting: Internal Medicine

## 2012-11-15 ENCOUNTER — Other Ambulatory Visit: Payer: Self-pay | Admitting: Cardiology

## 2013-01-15 ENCOUNTER — Other Ambulatory Visit: Payer: Self-pay | Admitting: Cardiology

## 2013-01-17 ENCOUNTER — Other Ambulatory Visit: Payer: Self-pay | Admitting: Emergency Medicine

## 2013-01-17 ENCOUNTER — Telehealth: Payer: Self-pay | Admitting: Internal Medicine

## 2013-01-17 MED ORDER — DABIGATRAN ETEXILATE MESYLATE 150 MG PO CAPS: 150.0000 mg | ORAL_CAPSULE | Freq: Two times a day (BID) | ORAL | Status: DC

## 2013-01-17 NOTE — Telephone Encounter (Signed)
Pt has a question to ask , asked if I can tell her what the call was regarding and he said no

## 2013-01-17 NOTE — Telephone Encounter (Signed)
Spoke with patient and with Dr Daleen Squibb leaving her would like to know if Dr [name] can take over his care.

## 2013-01-22 NOTE — Telephone Encounter (Signed)
Dr [name] has agreed to take him on as a patient, as he has seen him before.

## 2013-02-12 ENCOUNTER — Ambulatory Visit: Payer: Managed Care, Other (non HMO) | Admitting: Cardiology

## 2013-02-18 ENCOUNTER — Other Ambulatory Visit: Payer: Self-pay | Admitting: Internal Medicine

## 2013-03-20 ENCOUNTER — Other Ambulatory Visit: Payer: Self-pay | Admitting: Cardiology

## 2013-04-23 ENCOUNTER — Encounter: Payer: Self-pay | Admitting: Cardiology

## 2013-04-23 ENCOUNTER — Ambulatory Visit (INDEPENDENT_AMBULATORY_CARE_PROVIDER_SITE_OTHER): Payer: Managed Care, Other (non HMO) | Admitting: Cardiology

## 2013-04-23 VITALS — BP 103/65 | HR 71 | Ht 72.0 in | Wt 196.0 lb

## 2013-04-23 DIAGNOSIS — I4891 Unspecified atrial fibrillation: Secondary | ICD-10-CM

## 2013-04-23 DIAGNOSIS — I10 Essential (primary) hypertension: Secondary | ICD-10-CM

## 2013-04-23 DIAGNOSIS — E782 Mixed hyperlipidemia: Secondary | ICD-10-CM

## 2013-04-23 DIAGNOSIS — I2510 Atherosclerotic heart disease of native coronary artery without angina pectoris: Secondary | ICD-10-CM

## 2013-04-23 NOTE — Progress Notes (Signed)
HPI [name] returns today for his history of coronary artery disease, history of anterior myocardial infarction in the past, coronary intervention, history of paroxysmal atrial fibrillation status post ablation by Dr. [name], hypertension and mixed hyperlipidemia.  He's been doing remarkably well. He denies any chest pain, palpitations, presyncope syncope dyspnea on exertion or edema. He is remarkably compliant and has done well as a consequence. He is due for annual physical next month for blood work will be drawn.  Past Medical History  Diagnosis Date  . Atrial fibrillation     persistent  . Coronary artery disease 2006    s/p PCI x3   . Cerebrovascular disease   . Diabetes mellitus     type 2  . Morbid obesity     prior, has lost 120 lbs w/ lifestyle modification  . Hypertension   . Hyperlipidemia   . OSA (obstructive sleep apnea)     improved with weight loss    Current Outpatient Prescriptions  Medication Sig Dispense Refill  . aspirin 81 MG EC tablet Take 81 mg by mouth daily.        . carvedilol (COREG) 25 MG tablet Take 1 tablet (25 mg total) by mouth 2 (two) times daily.  60 tablet  11  . lisinopril (PRINIVIL,ZESTRIL) 5 MG tablet Take 5 mg by mouth daily.      . Multiple Vitamins-Iron (MULTIVITAMIN/IRON) TABS Take 1 tablet by mouth daily.       Marland Kitchen NITROSTAT 0.4 MG SL tablet DISSOLVE ONE TABLET UNDER THE TONGUE EVERY 5 MINUTES AS NEEDED FOR CHEST PAIN.  DO NOT EXCEED A TOTAL OF 3 DOSES IN 15 MINUTES  25 tablet  5  . Omega-3 Fatty Acids (FISH OIL) 1000 MG CAPS Take 2 capsules by mouth daily.       Marland Kitchen PRADAXA 150 MG CAPS TAKE ONE CAPSULE BY MOUTH EVERY 12 HOURS  60 capsule  3  . simvastatin (ZOCOR) 20 MG tablet TAKE ONE TABLET BY MOUTH EVERY DAY  30 tablet  11  . Tamsulosin HCl (FLOMAX) 0.4 MG CAPS Take 0.4 mg by mouth at bedtime.        No current facility-administered medications for this visit.    No Known Allergies  History reviewed. No pertinent family  history.  History   Social History  . Marital Status: Married    Spouse Name: N/A    Number of Children: N/A  . Years of Education: N/A   Occupational History  . Not on file.   Social History Main Topics  . Smoking status: Never Smoker   . Smokeless tobacco: Never Used  . Alcohol Use: No  . Drug Use: No  . Sexual Activity: Not on file   Other Topics Concern  . 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, wife manages [employer]    ROS ALL NEGATIVE EXCEPT THOSE NOTED IN HPI  PE  General Appearance: well developed, well nourished in no acute distress HEENT: symmetrical face, PERRLA, good dentition  Neck: no JVD, thyromegaly, or adenopathy, trachea midline Chest: symmetric without deformity Cardiac: PMI non-displaced, RRR, normal S1, S2, no gallop or murmur Lung: clear to ausculation and percussion Vascular: all pulses full without bruits  Abdominal: nondistended, nontender, good bowel sounds, no HSM, no bruits Extremities: no cyanosis, clubbing or edema, no sign of DVT, no varicosities  Skin: normal color, no rashes Neuro: alert and oriented x 3, non-focal Pysch: normal affect  EKG  Normal sinus rhythm, normal EKG BMET    Component Value Date/Time   NA 138 03/29/2012 1430   K 4.3 03/29/2012 1430   CL 103 03/29/2012 1430   CO2 30 03/29/2012 1430   GLUCOSE 90 03/29/2012 1430   BUN 18 03/29/2012 1430   CREATININE 0.76 03/29/2012 1430   CALCIUM 9.3 03/29/2012 1430   GFRNONAA >90 03/29/2012 1430   GFRAA >90 03/29/2012 1430    Lipid Panel     Component Value Date/Time   CHOL  Value: 109        ATP III CLASSIFICATION:  <200     mg/dL   Desirable  161-096  mg/dL   Borderline High  >=045    mg/dL   High        12/11/8117 0846   TRIG 32 04/15/2010 0846   HDL 39* 04/15/2010 0846   CHOLHDL 2.8 04/15/2010 0846   VLDL 6 04/15/2010 0846   LDLCALC  Value: 64        Total Cholesterol/HDL:CHD Risk Coronary Heart Disease Risk  Table                     Men   Women  1/2 Average Risk   3.4   3.3  Average Risk       5.0   4.4  2 X Average Risk   9.6   7.1  3 X Average Risk  23.4   11.0        Use the calculated Patient Ratio above and the CHD Risk Table to determine the patient's CHD Risk.        ATP III CLASSIFICATION (LDL):  <100     mg/dL   Optimal  147-829  mg/dL   Near or Above                    Optimal  130-159  mg/dL   Borderline  562-130  mg/dL   High  >865     mg/dL   Very High 7/84/6962 9528    CBC    Component Value Date/Time   WBC 5.5 03/29/2012 1430   RBC 4.56 03/29/2012 1430   HGB 14.6 03/29/2012 1430   HCT 39.5 03/29/2012 1430   PLT 125* 03/29/2012 1430   MCV 86.6 03/29/2012 1430   MCH 32.0 03/29/2012 1430   MCHC 37.0* 03/29/2012 1430   RDW 12.7 03/29/2012 1430   LYMPHSABS 1.7 03/29/2012 1430   MONOABS 0.7 03/29/2012 1430   EOSABS 0.6 03/29/2012 1430   BASOSABS 0.0 03/29/2012 1430

## 2013-04-23 NOTE — Assessment & Plan Note (Signed)
Status post ablation. Maintaining sinus rhythm. We'll followup with Dr. [name] in 6 months.

## 2013-04-23 NOTE — Patient Instructions (Addendum)
Your physician wants you to follow-up in: 6 MONTHS WITH DR.ALLRED You will receive a reminder letter in the mail two months in advance. If you don't receive a letter, please call our office to schedule the follow-up appointment.  Your physician recommends that you continue on your current medications as directed. Please refer to the Current Medication list given to you today.  

## 2013-04-23 NOTE — Assessment & Plan Note (Signed)
Stable. Continue aggressive secondary preventative therapy. He was to establish with Dr. [name]. I'll have him see him in 6 months.

## 2013-05-20 ENCOUNTER — Encounter: Payer: Self-pay | Admitting: Internal Medicine

## 2013-07-22 ENCOUNTER — Other Ambulatory Visit: Payer: Self-pay | Admitting: Internal Medicine

## 2013-07-22 ENCOUNTER — Other Ambulatory Visit: Payer: Self-pay | Admitting: Cardiology

## 2013-07-22 ENCOUNTER — Other Ambulatory Visit: Payer: Self-pay | Admitting: *Deleted

## 2013-07-22 MED ORDER — DABIGATRAN ETEXILATE MESYLATE 150 MG PO CAPS: 150.0000 mg | ORAL_CAPSULE | Freq: Two times a day (BID) | ORAL | Status: DC

## 2013-08-21 ENCOUNTER — Other Ambulatory Visit: Payer: Self-pay | Admitting: Cardiology

## 2013-10-22 ENCOUNTER — Ambulatory Visit: Payer: Managed Care, Other (non HMO) | Admitting: Internal Medicine

## 2013-11-24 ENCOUNTER — Other Ambulatory Visit: Payer: Self-pay | Admitting: Cardiology

## 2013-11-24 ENCOUNTER — Other Ambulatory Visit: Payer: Self-pay | Admitting: Internal Medicine

## 2013-12-15 ENCOUNTER — Encounter: Payer: Self-pay | Admitting: Internal Medicine

## 2013-12-15 ENCOUNTER — Ambulatory Visit (INDEPENDENT_AMBULATORY_CARE_PROVIDER_SITE_OTHER): Payer: Managed Care, Other (non HMO) | Admitting: Internal Medicine

## 2013-12-15 VITALS — BP 121/75 | HR 67 | Ht 72.0 in | Wt 197.0 lb

## 2013-12-15 DIAGNOSIS — I2510 Atherosclerotic heart disease of native coronary artery without angina pectoris: Secondary | ICD-10-CM

## 2013-12-15 DIAGNOSIS — I4891 Unspecified atrial fibrillation: Secondary | ICD-10-CM

## 2013-12-15 DIAGNOSIS — I10 Essential (primary) hypertension: Secondary | ICD-10-CM

## 2013-12-15 NOTE — Patient Instructions (Addendum)
Your physician wants you to follow-up in: 6 months with Dr Johney FrameAllred with echo prior You will receive a reminder letter in the mail two months in advance. If you don't receive a letter, please call our office to schedule the follow-up appointment.  Your physician has requested that you have an echocardiogram. Echocardiography is a painless test that uses sound waves to create images of your heart. It provides your doctor with information about the size and shape of your heart and how well your heart's chambers and valves are working. This procedure takes approximately one hour. There are no restrictions for this procedure.---in 6 months prior to apt with Allred  Your physician has recommended you make the following change in your medication:  1) Stop Pradaxa

## 2013-12-15 NOTE — Progress Notes (Signed)
PCP: Cassie FreerKELLEY,WILLIAM, MD  [name] is a 53 y.o. male who presents today for routine cardiology followup.  Since last being seen in our clinic, the patient reports doing very well.  He has had no afib since 2011.  He remains very active.  Today, he denies symptoms of palpitations, chest pain, shortness of breath,  lower extremity edema, dizziness, presyncope, or syncope.  The patient is otherwise without complaint today.   Past Medical History  Diagnosis Date  . Atrial fibrillation     persistent  . Coronary artery disease 2006    s/p PCI x3   . Cerebrovascular disease   . Diabetes mellitus     resolved with weight loss  . Morbid obesity     prior, has lost 120 lbs w/ lifestyle modification  . Hypertension     resolved with weight loss  . Hyperlipidemia   . OSA (obstructive sleep apnea)     improved with weight loss   Past Surgical History  Procedure Laterality Date  . Eye surgery      at 3118 months of age  . Atrial ablation surgery  02/08/09 and 07/21/10    Current Outpatient Prescriptions  Medication Sig Dispense Refill  . aspirin 81 MG EC tablet Take 81 mg by mouth daily.        . carvedilol (COREG) 25 MG tablet TAKE ONE TABLET BY MOUTH TWICE DAILY  60 tablet  1  . lisinopril (PRINIVIL,ZESTRIL) 5 MG tablet TAKE ONE TABLET BY MOUTH ONCE DAILY  30 tablet  1  . Multiple Vitamins-Iron (MULTIVITAMIN/IRON) TABS Take 1 tablet by mouth daily.       Marland Kitchen. NITROSTAT 0.4 MG SL tablet DISSOLVE ONE TABLET UNDER THE TONGUE EVERY 5 MINUTES AS NEEDED FOR CHEST PAIN.  DO NOT EXCEED A TOTAL OF 3 DOSES IN 15 MINUTES  25 tablet  5  . Omega-3 Fatty Acids (FISH OIL) 1000 MG CAPS Take 2 capsules by mouth daily.       . simvastatin (ZOCOR) 20 MG tablet TAKE ONE TABLET BY MOUTH ONCE DAILY  30 tablet  0  . Tamsulosin HCl (FLOMAX) 0.4 MG CAPS Take 0.4 mg by mouth at bedtime.        No current facility-administered medications for this visit.    Physical Exam: Filed Vitals:   12/15/13 1638  BP:  121/75  Pulse: 67  Height: 6' (1.829 m)  Weight: 197 lb (89.359 kg)    GEN- The patient is well appearing, alert and oriented x 3 today.   Head- normocephalic, atraumatic Eyes-  Sclera clear, conjunctiva pink Ears- hearing intact Oropharynx- clear Lungs- Clear to ausculation bilaterally, normal work of breathing Heart- Regular rate and rhythm, no murmurs, rubs or gallops, PMI not laterally displaced GI- soft, NT, ND, + BS Extremities- no clubbing, cyanosis, or edema  ekg today reveals sinus rhythm 67 bpm, otherwise normal ekg  Assessment and Plan:  1. Cad No ischemic symptoms Continue current medicines Repeat echo upon return  2. afib Well controlled without recurrence post ablation in 2011. Stop pradaxa and continue ASA Given that htn and dm have resolved with weight reduction, his chads2vasc score is 1.  Return in 6 months

## 2014-02-05 ENCOUNTER — Other Ambulatory Visit: Payer: Self-pay | Admitting: Internal Medicine

## 2014-03-11 ENCOUNTER — Other Ambulatory Visit: Payer: Self-pay | Admitting: Internal Medicine

## 2014-04-16 ENCOUNTER — Other Ambulatory Visit: Payer: Self-pay | Admitting: Internal Medicine

## 2014-05-25 ENCOUNTER — Other Ambulatory Visit (HOSPITAL_COMMUNITY): Payer: Managed Care, Other (non HMO)

## 2014-05-26 ENCOUNTER — Telehealth: Payer: Self-pay | Admitting: *Deleted

## 2014-05-26 NOTE — Telephone Encounter (Signed)
Patient called for a new discount card for pradaxa. According to his last office visit, he was to stop taking this. He was unaware of that and has been taking it since then. Please advise. Thanks, MI

## 2014-05-26 NOTE — Telephone Encounter (Signed)
I have left him a message according to his last office note in April Dr Allred stopped at that visit.  He can stop now ans keep his follow up as scheduled

## 2014-06-15 ENCOUNTER — Ambulatory Visit: Payer: Managed Care, Other (non HMO) | Admitting: Internal Medicine

## 2014-06-22 ENCOUNTER — Other Ambulatory Visit: Payer: Self-pay | Admitting: Internal Medicine

## 2014-07-11 ENCOUNTER — Other Ambulatory Visit: Payer: Self-pay | Admitting: Internal Medicine

## 2014-07-13 ENCOUNTER — Other Ambulatory Visit: Payer: Self-pay | Admitting: *Deleted

## 2014-07-13 MED ORDER — NITROGLYCERIN 0.4 MG SL SUBL: SUBLINGUAL_TABLET | SUBLINGUAL | Status: DC

## 2014-08-03 ENCOUNTER — Other Ambulatory Visit (HOSPITAL_COMMUNITY): Payer: Managed Care, Other (non HMO)

## 2014-08-10 ENCOUNTER — Ambulatory Visit (HOSPITAL_COMMUNITY): Payer: Managed Care, Other (non HMO) | Attending: Internal Medicine | Admitting: Radiology

## 2014-08-10 DIAGNOSIS — I4891 Unspecified atrial fibrillation: Secondary | ICD-10-CM | POA: Diagnosis present

## 2014-08-10 NOTE — Progress Notes (Signed)
Echocardiogram performed.  

## 2014-08-17 ENCOUNTER — Encounter: Payer: Self-pay | Admitting: Internal Medicine

## 2014-08-24 ENCOUNTER — Encounter: Payer: Self-pay | Admitting: Internal Medicine

## 2014-08-24 ENCOUNTER — Ambulatory Visit (INDEPENDENT_AMBULATORY_CARE_PROVIDER_SITE_OTHER): Payer: Managed Care, Other (non HMO) | Admitting: Internal Medicine

## 2014-08-24 VITALS — BP 100/58 | HR 74 | Ht 72.0 in | Wt 199.8 lb

## 2014-08-24 DIAGNOSIS — I2510 Atherosclerotic heart disease of native coronary artery without angina pectoris: Secondary | ICD-10-CM

## 2014-08-24 DIAGNOSIS — I4891 Unspecified atrial fibrillation: Secondary | ICD-10-CM

## 2014-08-24 NOTE — Patient Instructions (Signed)
Your physician wants you to follow-up in: 12 months with Dr Allred You will receive a reminder letter in the mail two months in advance. If you don't receive a letter, please call our office to schedule the follow-up appointment.  

## 2014-08-24 NOTE — Progress Notes (Signed)
PCP: Cassie FreerKELLEY,WILLIAM, MD  John EricJeffrey T Tippin is a [age] y.o. male who presents today for routine cardiology followup.  Since last being seen in our clinic, the patient reports doing very well.  He has not had any evidence for reoccurrence of afib since last ablation in 2011. Echo this month  showed normal EF, no structural abnormalities.  He remains very active.  Bp 100 systolic but he feels well and does not want any reduction in his medication at this time.  Today, he denies symptoms of palpitations, chest pain, shortness of breath,  lower extremity edema, dizziness, presyncope, or syncope.  The patient is otherwise without complaint today.   Past Medical History  Diagnosis Date  . Atrial fibrillation     persistent  . Coronary artery disease 2006    s/p PCI x3   . Cerebrovascular disease   . Diabetes mellitus     resolved with weight loss  . Morbid obesity     prior, has lost 120 lbs w/ lifestyle modification  . Hypertension     resolved with weight loss  . Hyperlipidemia   . OSA (obstructive sleep apnea)     improved with weight loss   Past Surgical History  Procedure Laterality Date  . Eye surgery      at 5618 months of age  . Atrial ablation surgery  02/08/09 and 07/21/10    Current Outpatient Prescriptions  Medication Sig Dispense Refill  . aspirin 81 MG EC tablet Take 81 mg by mouth daily.      . carvedilol (COREG) 25 MG tablet TAKE ONE TABLET BY MOUTH TWICE DAILY 60 tablet 0  . lisinopril (PRINIVIL,ZESTRIL) 5 MG tablet TAKE ONE TABLET BY MOUTH ONCE DAILY 30 tablet 0  . Multiple Vitamins-Iron (MULTIVITAMIN/IRON) TABS Take 1 tablet by mouth daily.     . nitroGLYCERIN (NITROSTAT) 0.4 MG SL tablet DISSOLVE ONE TABLET UNDER THE TONGUE EVERY 5 MINUTES AS NEEDED FOR CHEST PAIN.  DO NOT EXCEED A TOTAL OF 3 DOSES IN 15 MINUTES 25 tablet 5  . Omega-3 Fatty Acids (FISH OIL) 1000 MG CAPS Take 2 capsules by mouth daily.     . simvastatin (ZOCOR) 20 MG tablet TAKE ONE TABLET BY MOUTH ONCE  DAILY 30 tablet 0  . Tamsulosin HCl (FLOMAX) 0.4 MG CAPS Take 0.4 mg by mouth at bedtime.     . fluticasone (FLONASE) 50 MCG/ACT nasal spray Place 2 sprays into the nose daily as needed. congestion     No current facility-administered medications for this visit.    Physical Exam: Filed Vitals:   08/24/14 1014  BP: 100/58  Pulse: 74  Height: 6' (1.829 m)  Weight: 199 lb 12.8 oz (90.629 kg)    GEN- The patient is well appearing, alert and oriented x 3 today.   Head- normocephalic, atraumatic Eyes-  Sclera clear, conjunctiva pink Ears- hearing intact Oropharynx- clear Lungs- Clear to ausculation bilaterally, normal work of breathing Heart- Regular rate and rhythm, no murmurs, rubs or gallops, PMI not laterally displaced GI- soft, NT, ND, + BS Extremities- no clubbing, cyanosis, or edema  ekg today reveals sinus rhythm 74 bpm, otherwise normal ekg  Assessment and Plan:  1. Cad No ischemic symptoms Continue current medicines  2. afib Well controlled without recurrence post ablation in 2011. Continue ASA Given that htn and dm have resolved with weight reduction, his chads2vasc score is 1.  3. HTN BP today around 100 systolic, but asymptomatic, offered to decrease carvedilol 25 mg bid to  1/2 tab bid but he deferred.states Bp usually runs around 120 systolic. Consider this in future if BP continues to be low and symptomatic with dizziness, presyncopy.  F/u in one year.  Hillis RangeJames Magon Croson MD 08/24/2014 1:56 PM

## 2014-08-25 ENCOUNTER — Other Ambulatory Visit: Payer: Self-pay | Admitting: Internal Medicine

## 2015-08-23 ENCOUNTER — Other Ambulatory Visit: Payer: Self-pay | Admitting: Internal Medicine

## 2015-08-23 ENCOUNTER — Encounter: Payer: Self-pay | Admitting: Internal Medicine

## 2015-09-02 ENCOUNTER — Encounter: Payer: Self-pay | Admitting: Internal Medicine

## 2015-09-02 ENCOUNTER — Ambulatory Visit (INDEPENDENT_AMBULATORY_CARE_PROVIDER_SITE_OTHER): Payer: Managed Care, Other (non HMO) | Admitting: Internal Medicine

## 2015-09-02 VITALS — BP 100/78 | HR 69 | Ht 72.0 in | Wt 204.0 lb

## 2015-09-02 DIAGNOSIS — I2510 Atherosclerotic heart disease of native coronary artery without angina pectoris: Secondary | ICD-10-CM

## 2015-09-02 DIAGNOSIS — I10 Essential (primary) hypertension: Secondary | ICD-10-CM | POA: Diagnosis not present

## 2015-09-02 DIAGNOSIS — I480 Paroxysmal atrial fibrillation: Secondary | ICD-10-CM | POA: Diagnosis not present

## 2015-09-02 DIAGNOSIS — I4891 Unspecified atrial fibrillation: Secondary | ICD-10-CM

## 2015-09-02 MED ORDER — CARVEDILOL 12.5 MG PO TABS: 12.5000 mg | ORAL_TABLET | Freq: Two times a day (BID) | ORAL | Status: DC

## 2015-09-02 NOTE — Patient Instructions (Signed)
Medication Instructions:  Your physician has recommended you make the following change in your medication:  1) Decrease Carvedilol to 12.5 mg twice daily    Labwork: None ordered   Testing/Procedures: None ordered   Follow-Up: Your physician wants you to follow-up in: 12 months with Dr Jacquiline DoeAllred You will receive a reminder letter in the mail two months in advance. If you don't receive a letter, please call our office to schedule the follow-up appointment.   Any Other Special Instructions Will Be Listed Below (If Applicable).     If you need a refill on your cardiac medications before your next appointment, please call your pharmacy.

## 2015-09-02 NOTE — Progress Notes (Signed)
PCP: Cassie Freer, MD  [name] is a 54 y.o. male who presents today for routine cardiology followup.  Since last being seen in our clinic, the patient reports doing very well.  He has not had any evidence for reoccurrence of afib since last ablation in 2011.  He remains very active.  Bp 100 systolic but he feels well and does not want any reduction in his medication at this time.  Today, he denies symptoms of palpitations, chest pain, shortness of breath,  lower extremity edema, dizziness, presyncope, or syncope.  The patient is otherwise without complaint today.   Past Medical History  Diagnosis Date  . Atrial fibrillation (HCC)     persistent  . Coronary artery disease 2006    s/p PCI x3   . Cerebrovascular disease   . Diabetes mellitus     resolved with weight loss  . Morbid obesity (HCC)     prior, has lost 120 lbs w/ lifestyle modification  . Hypertension     resolved with weight loss  . Hyperlipidemia   . OSA (obstructive sleep apnea)     improved with weight loss   Past Surgical History  Procedure Laterality Date  . Eye surgery      at 20 months of age  . Atrial ablation surgery  02/08/09 and 07/21/10    Current Outpatient Prescriptions  Medication Sig Dispense Refill  . aspirin 81 MG EC tablet Take 81 mg by mouth daily.      . carvedilol (COREG) 12.5 MG tablet Take 1 tablet (12.5 mg total) by mouth 2 (two) times daily with a meal. 180 tablet 3  . fluticasone (FLONASE) 50 MCG/ACT nasal spray Place 2 sprays into the nose daily as needed. congestion    . lisinopril (PRINIVIL,ZESTRIL) 5 MG tablet TAKE ONE TABLET BY MOUTH ONCE DAILY 30 tablet 12  . Multiple Vitamins-Iron (MULTIVITAMIN/IRON) TABS Take 1 tablet by mouth daily.     Marland Kitchen NITROSTAT 0.4 MG SL tablet DISSOLVE ONE TABLET UNDER THE TONGUE EVERY 5 MINUTES AS NEEDED FOR CHEST PAIN.  DO NOT EXCEED A TOTAL OF 3 DOSES IN 15 MINUTES 25 tablet 2  . Omega-3 Fatty Acids (FISH OIL) 1000 MG CAPS Take 2 capsules by mouth  daily.     . simvastatin (ZOCOR) 20 MG tablet TAKE ONE TABLET BY MOUTH ONCE DAILY 30 tablet 12   No current facility-administered medications for this visit.    Physical Exam: Filed Vitals:   09/02/15 0917  BP: 100/78  Pulse: 69  Height: 6' (1.829 m)  Weight: 204 lb (92.534 kg)    GEN- The patient is well appearing, alert and oriented x 3 today.   Head- normocephalic, atraumatic Eyes-  Sclera clear, conjunctiva pink Ears- hearing intact Oropharynx- clear Lungs- Clear to ausculation bilaterally, normal work of breathing Heart- Regular rate and rhythm, no murmurs, rubs or gallops, PMI not laterally displaced GI- soft, NT, ND, + BS Extremities- no clubbing, cyanosis, or edema  ekg today reveals sinus rhythm 69 bpm, otherwise normal ekg Labs from [facility] reviewed  Assessment and Plan:  1. Cad No ischemic symptoms Reduce coreg to 12.5mg  BID Lipids reviewed from primary care  2. afib Well controlled without recurrence post ablation in 2011. Continue ASA Given that htn and dm have resolved with weight reduction, his chads2vasc score is 1.  3. HTN BP today around 100 systolic, but asymptomatic.  Reduce coreg to 12.5mg  BID today  F/u in one year.  Hillis Range MD  09/02/2015 10:19 AM

## 2015-10-09 ENCOUNTER — Other Ambulatory Visit: Payer: Self-pay | Admitting: Internal Medicine

## 2015-10-12 ENCOUNTER — Other Ambulatory Visit: Payer: Self-pay | Admitting: Internal Medicine

## 2015-10-12 MED ORDER — CARVEDILOL 12.5 MG PO TABS: 12.5000 mg | ORAL_TABLET | Freq: Two times a day (BID) | ORAL | Status: DC

## 2016-06-09 ENCOUNTER — Other Ambulatory Visit: Payer: Self-pay | Admitting: Internal Medicine

## 2016-08-14 ENCOUNTER — Ambulatory Visit (INDEPENDENT_AMBULATORY_CARE_PROVIDER_SITE_OTHER): Payer: Managed Care, Other (non HMO) | Admitting: Internal Medicine

## 2016-08-14 ENCOUNTER — Encounter: Payer: Self-pay | Admitting: Internal Medicine

## 2016-08-14 VITALS — BP 110/70 | HR 79 | Ht 72.0 in | Wt 203.8 lb

## 2016-08-14 DIAGNOSIS — I4891 Unspecified atrial fibrillation: Secondary | ICD-10-CM

## 2016-08-14 NOTE — Progress Notes (Signed)
   PCP: Cassie FreerKELLEY,WILLIAM, MD  [name] is a 55 y.o. male who presents today for routine cardiology followup.  Since last being seen in our clinic, the patient reports doing very well.  He has not had any evidence for reoccurrence of afib since last ablation in 2011.  He remains very active.   Today, he denies symptoms of palpitations, chest pain, shortness of breath,  lower extremity edema, dizziness, presyncope, or syncope.  The patient is otherwise without complaint today.   Past Medical History:  Diagnosis Date  . Atrial fibrillation (HCC)    persistent  . Cerebrovascular disease   . Coronary artery disease 2006   s/p PCI x3   . Diabetes mellitus    resolved with weight loss  . Hyperlipidemia   . Hypertension    resolved with weight loss  . Morbid obesity (HCC)    prior, has lost 120 lbs w/ lifestyle modification  . OSA (obstructive sleep apnea)    improved with weight loss   Past Surgical History:  Procedure Laterality Date  . ATRIAL ABLATION SURGERY  02/08/09 and 07/21/10  . EYE SURGERY     at 5418 months of age    Current Outpatient Prescriptions  Medication Sig Dispense Refill  . aspirin 81 MG EC tablet Take 81 mg by mouth daily.      . carvedilol (COREG) 12.5 MG tablet TAKE ONE TABLET BY MOUTH TWICE DAILY WITH A MEAL 180 tablet 0  . lisinopril (PRINIVIL,ZESTRIL) 5 MG tablet TAKE ONE TABLET BY MOUTH ONCE DAILY 30 tablet 11  . Multiple Vitamins-Iron (MULTIVITAMIN/IRON) TABS Take 1 tablet by mouth daily.     Marland Kitchen. NITROSTAT 0.4 MG SL tablet DISSOLVE ONE TABLET UNDER THE TONGUE EVERY 5 MINUTES AS NEEDED FOR CHEST PAIN.  DO NOT EXCEED A TOTAL OF 3 DOSES IN 15 MINUTES 25 tablet 2  . Omega-3 Fatty Acids (FISH OIL) 1000 MG CAPS Take 2 capsules by mouth daily.     . simvastatin (ZOCOR) 20 MG tablet TAKE ONE TABLET BY MOUTH ONCE DAILY 30 tablet 11  . fluticasone (FLONASE) 50 MCG/ACT nasal spray Place 2 sprays into the nose daily as needed. congestion     No current  facility-administered medications for this visit.     Physical Exam: Vitals:   08/14/16 0942  BP: 110/70  Pulse: 79  SpO2: 98%  Weight: 203 lb 12.8 oz (92.4 kg)  Height: 6' (1.829 m)    GEN- The patient is well appearing, alert and oriented x 3 today.   Head- normocephalic, atraumatic Eyes-  Sclera clear, conjunctiva pink Ears- hearing intact Oropharynx- clear Lungs- Clear to ausculation bilaterally, normal work of breathing Heart- Regular rate and rhythm, no murmurs, rubs or gallops, PMI not laterally displaced GI- soft, NT, ND, + BS Extremities- no clubbing, cyanosis, or edema  ekg today reveals sinus rhythm 73 bpm, PR 142 msec, septal infarct pattern Assessment and Plan:  1. Cad No ischemic symptoms He will forward labs from PCP for my review  2. afib Well controlled without recurrence post ablation in 2011. Continue ASA Given that htn and dm have resolved with weight reduction, his chads2vasc score is 1.  3. HTN Well controlled  F/u in one year.  Hillis RangeJames Tyreon Frigon MD 08/14/2016 10:06 AM

## 2016-08-14 NOTE — Patient Instructions (Addendum)
Medication Instructions:  Your physician recommends that you continue on your current medications as directed. Please refer to the Current Medication list given to you today.   Labwork: None Ordered   Testing/Procedures: None Ordered   Follow-Up: Your physician wants you to follow-up in: 12 months with Dr. Johney FrameAllred. You will receive a reminder letter in the mail two months in advance. If you don't receive a letter, please call our office to schedule the follow-up appointment.    Any Other Special Instructions Will Be Listed Below (If Applicable).     If you need a refill on your cardiac medications before your next appointment, please call your pharmacy.

## 2016-09-25 ENCOUNTER — Other Ambulatory Visit: Payer: Self-pay | Admitting: Internal Medicine

## 2016-11-13 ENCOUNTER — Other Ambulatory Visit: Payer: Self-pay | Admitting: Internal Medicine

## 2017-03-20 ENCOUNTER — Other Ambulatory Visit: Payer: Self-pay | Admitting: Internal Medicine

## 2017-08-17 ENCOUNTER — Encounter: Payer: Self-pay | Admitting: Internal Medicine

## 2017-08-22 ENCOUNTER — Encounter: Payer: Self-pay | Admitting: Internal Medicine

## 2017-08-22 ENCOUNTER — Ambulatory Visit (INDEPENDENT_AMBULATORY_CARE_PROVIDER_SITE_OTHER): Payer: Managed Care, Other (non HMO) | Admitting: Internal Medicine

## 2017-08-22 VITALS — BP 100/80 | HR 68 | Ht 72.0 in | Wt 213.2 lb

## 2017-08-22 DIAGNOSIS — I480 Paroxysmal atrial fibrillation: Secondary | ICD-10-CM

## 2017-08-22 NOTE — Patient Instructions (Addendum)

## 2017-08-22 NOTE — Progress Notes (Signed)
   PCP: Cassie FreerKelley, William, MD Primary Cardiologist: previously Dr Daleen SquibbWall Primary EP: Dr Knox SalivaAllred  [name] is a 56 y.o. male who presents today for routine electrophysiology followup.  Since last being seen in our clinic, the patient reports doing very well. No afib since ablation in 2011!  He expects that his job may be terminated with down sizing soon.  He is a bit down about this.  He is thinking about going back to school.  Today, he denies symptoms of palpitations, chest pain, shortness of breath,  lower extremity edema, dizziness, presyncope, or syncope.  The patient is otherwise without complaint today.   Past Medical History:  Diagnosis Date  . Atrial fibrillation (HCC)    persistent  . Cerebrovascular disease   . Coronary artery disease 2006   s/p PCI x3   . Diabetes mellitus    resolved with weight loss  . Hyperlipidemia   . Hypertension    resolved with weight loss  . Morbid obesity (HCC)    prior, has lost 120 lbs w/ lifestyle modification  . OSA (obstructive sleep apnea)    improved with weight loss   Past Surgical History:  Procedure Laterality Date  . ATRIAL ABLATION SURGERY  02/08/09 and 07/21/10  . EYE SURGERY     at 6218 months of age    ROS- all systems are reviewed and negatives except as per HPI above  Current Outpatient Medications  Medication Sig Dispense Refill  . aspirin 81 MG EC tablet Take 81 mg by mouth daily.      . carvedilol (COREG) 12.5 MG tablet TAKE ONE TABLET BY MOUTH TWICE DAILY WITH MEALS 180 tablet 2  . fluticasone (FLONASE) 50 MCG/ACT nasal spray Place 2 sprays into the nose daily as needed. congestion    . lisinopril (PRINIVIL,ZESTRIL) 5 MG tablet TAKE ONE TABLET BY MOUTH ONCE DAILY 30 tablet 11  . Multiple Vitamins-Iron (MULTIVITAMIN/IRON) TABS Take 1 tablet by mouth daily.     Marland Kitchen. NITROSTAT 0.4 MG SL tablet DISSOLVE ONE TABLET UNDER THE TONGUE EVERY 5 MINUTES AS NEEDED FOR CHEST PAIN.DO NOT EXCEED A TOTAL OF 3 DOSES IN 15 MINUTES. 75  tablet 1  . Omega-3 Fatty Acids (FISH OIL) 1000 MG CAPS Take 2 capsules by mouth daily.     . simvastatin (ZOCOR) 20 MG tablet TAKE ONE TABLET BY MOUTH ONCE DAILY 30 tablet 11   No current facility-administered medications for this visit.     Physical Exam: Vitals:   08/22/17 1610  BP: 100/80  Pulse: 68  SpO2: 99%  Weight: 213 lb 3.2 oz (96.7 kg)  Height: 6' (1.829 m)    GEN- The patient is well appearing, alert and oriented x 3 today.   Head- normocephalic, atraumatic Eyes-  Sclera clear, conjunctiva pink Ears- hearing intact Oropharynx- clear Lungs- Clear to ausculation bilaterally, normal work of breathing Heart- Regular rate and rhythm, no murmurs, rubs or gallops, PMI not laterally displaced GI- soft, NT, ND, + BS Extremities- no clubbing, cyanosis, or edema  EKG tracing ordered today is personally reviewed and shows sinus rhythm 68 bpm, otherwise normal ekg  Assessment and Plan:  1. Paroxysmal atrial fibrillation Well controlled post ablation off AAD therapy chads2vasc score is 1,  Not on anticoagulation  2. CAD No ischemic symptoms PCP follows lipids  Return in a year  Hillis RangeJames Murline Weigel MD, [facility] 08/22/2017 4:25 PM

## 2017-10-18 ENCOUNTER — Other Ambulatory Visit: Payer: Self-pay | Admitting: Internal Medicine

## 2018-03-03 ENCOUNTER — Other Ambulatory Visit: Payer: Self-pay | Admitting: Internal Medicine

## 2018-03-15 DIAGNOSIS — H6123 Impacted cerumen, bilateral: Secondary | ICD-10-CM | POA: Diagnosis not present

## 2018-03-15 DIAGNOSIS — H9209 Otalgia, unspecified ear: Secondary | ICD-10-CM | POA: Diagnosis not present

## 2018-04-24 DIAGNOSIS — R5383 Other fatigue: Secondary | ICD-10-CM | POA: Diagnosis not present

## 2018-04-24 DIAGNOSIS — R197 Diarrhea, unspecified: Secondary | ICD-10-CM | POA: Diagnosis not present

## 2018-08-08 DIAGNOSIS — B349 Viral infection, unspecified: Secondary | ICD-10-CM | POA: Diagnosis not present

## 2018-08-08 DIAGNOSIS — H6121 Impacted cerumen, right ear: Secondary | ICD-10-CM | POA: Diagnosis not present

## 2018-08-19 ENCOUNTER — Encounter: Payer: Self-pay | Admitting: Internal Medicine

## 2018-08-19 ENCOUNTER — Ambulatory Visit: Payer: BLUE CROSS/BLUE SHIELD | Admitting: Internal Medicine

## 2018-08-19 VITALS — BP 116/72 | HR 74 | Ht 72.0 in | Wt 228.0 lb

## 2018-08-19 DIAGNOSIS — I480 Paroxysmal atrial fibrillation: Secondary | ICD-10-CM

## 2018-08-19 DIAGNOSIS — I2510 Atherosclerotic heart disease of native coronary artery without angina pectoris: Secondary | ICD-10-CM | POA: Diagnosis not present

## 2018-08-19 NOTE — Patient Instructions (Signed)

## 2018-08-19 NOTE — Progress Notes (Signed)
   PCP: John FreerKelley, William, MD   Primary EP: Dr [name]  [name] is a 57 y.o. male who presents today for routine electrophysiology followup.  Since last being seen in our clinic, the patient reports doing very well.  Today, he denies symptoms of palpitations, chest pain, shortness of breath,  lower extremity edema, dizziness, presyncope, or syncope.  The patient is otherwise without complaint today.   Past Medical History:  Diagnosis Date  . Atrial fibrillation (HCC)    persistent  . Cerebrovascular disease   . Coronary artery disease 2006   s/p PCI x3   . Diabetes mellitus    resolved with weight loss  . Hyperlipidemia   . Hypertension    resolved with weight loss  . Morbid obesity (HCC)    prior, has lost 120 lbs w/ lifestyle modification  . OSA (obstructive sleep apnea)    improved with weight loss   Past Surgical History:  Procedure Laterality Date  . ATRIAL ABLATION SURGERY  02/08/09 and 07/21/10  . EYE SURGERY     at 3618 months of age    ROS- all systems are reviewed and negatives except as per HPI above  Current Outpatient Medications  Medication Sig Dispense Refill  . aspirin 81 MG EC tablet Take 81 mg by mouth daily.      . carvedilol (COREG) 12.5 MG tablet TAKE ONE TABLET BY MOUTH TWICE DAILY WITH MEALS 180 tablet 2  . lisinopril (PRINIVIL,ZESTRIL) 5 MG tablet TAKE 1 TABLET BY MOUTH ONCE DAILY 90 tablet 1  . Multiple Vitamins-Iron (MULTIVITAMIN/IRON) TABS Take 1 tablet by mouth daily.     Marland Kitchen. NITROSTAT 0.4 MG SL tablet DISSOLVE ONE TABLET UNDER THE TONGUE EVERY 5 MINUTES AS NEEDED FOR CHEST PAIN.DO NOT EXCEED A TOTAL OF 3 DOSES IN 15 MINUTES. 75 tablet 1  . Omega-3 Fatty Acids (FISH OIL) 1000 MG CAPS Take 2 capsules by mouth daily.     . simvastatin (ZOCOR) 20 MG tablet TAKE 1 TABLET BY MOUTH ONCE DAILY 90 tablet 1  . fluticasone (FLONASE) 50 MCG/ACT nasal spray Place 2 sprays into the nose daily as needed. congestion     No current facility-administered  medications for this visit.     Physical Exam: Vitals:   08/19/18 1000  BP: 116/72  Pulse: 74  SpO2: 97%  Weight: 228 lb (103.4 kg)  Height: 6' (1.829 m)    GEN- The patient is well appearing, alert and oriented x 3 today.   Head- normocephalic, atraumatic Eyes-  Sclera clear, conjunctiva pink Ears- hearing intact Oropharynx- clear Lungs- Clear to ausculation bilaterally, normal work of breathing Heart- Regular rate and rhythm, no murmurs, rubs or gallops, PMI not laterally displaced GI- soft, NT, ND, + BS Extremities- no clubbing, cyanosis, or edema  Wt Readings from Last 3 Encounters:  08/19/18 228 lb (103.4 kg)  08/22/17 213 lb 3.2 oz (96.7 kg)  08/14/16 203 lb 12.8 oz (92.4 kg)    EKG tracing ordered today is personally reviewed and shows sinus rhythm, normal ekg  Assessment and Plan:  1. Paroxysmal atrial fibrillation He has done great post ablation off AAD therapy chads2vasc score is 1.  He is not on anticoagulation  2. CAD No ischemic symptoms Stable No change required today  Return in a year  Hillis RangeJames Morrill Bomkamp MD, [facility] 08/19/2018 10:25 AM

## 2018-09-10 DIAGNOSIS — B349 Viral infection, unspecified: Secondary | ICD-10-CM | POA: Diagnosis not present

## 2018-09-30 ENCOUNTER — Other Ambulatory Visit: Payer: Self-pay | Admitting: Internal Medicine

## 2018-10-21 DIAGNOSIS — H6691 Otitis media, unspecified, right ear: Secondary | ICD-10-CM | POA: Diagnosis not present

## 2018-11-18 DIAGNOSIS — E782 Mixed hyperlipidemia: Secondary | ICD-10-CM | POA: Diagnosis not present

## 2018-11-18 DIAGNOSIS — E663 Overweight: Secondary | ICD-10-CM | POA: Diagnosis not present

## 2018-11-18 DIAGNOSIS — Z125 Encounter for screening for malignant neoplasm of prostate: Secondary | ICD-10-CM | POA: Diagnosis not present

## 2018-11-18 DIAGNOSIS — Z0001 Encounter for general adult medical examination with abnormal findings: Secondary | ICD-10-CM | POA: Diagnosis not present

## 2018-11-20 DIAGNOSIS — E6609 Other obesity due to excess calories: Secondary | ICD-10-CM | POA: Diagnosis not present

## 2018-11-20 DIAGNOSIS — Z0001 Encounter for general adult medical examination with abnormal findings: Secondary | ICD-10-CM | POA: Diagnosis not present

## 2018-11-20 DIAGNOSIS — E782 Mixed hyperlipidemia: Secondary | ICD-10-CM | POA: Diagnosis not present

## 2018-11-20 DIAGNOSIS — I10 Essential (primary) hypertension: Secondary | ICD-10-CM | POA: Diagnosis not present

## 2019-05-16 ENCOUNTER — Other Ambulatory Visit: Payer: Self-pay | Admitting: Internal Medicine

## 2019-05-16 MED ORDER — NITROGLYCERIN 0.4 MG SL SUBL: SUBLINGUAL_TABLET | SUBLINGUAL | 3 refills | Status: DC

## 2019-05-16 NOTE — Telephone Encounter (Signed)
Pt called in leaving a message, requesting a refill on carvedilol. I called pt's pharmacy and the pharmacist stated that pt does not need a refill on carvedilol, pt has refills left, but pt needed a refill on nitroglycerin. I sent in a refill on this medication and also asked pharmacy if they will refill carvedilol as well. I called pt's phone and left message explaining this to the pt and asking him to call back if he has any other problems, questions or concerns.

## 2019-09-01 ENCOUNTER — Telehealth: Payer: BC Managed Care – PPO | Admitting: Internal Medicine

## 2019-09-08 ENCOUNTER — Other Ambulatory Visit: Payer: Self-pay

## 2019-09-08 ENCOUNTER — Encounter: Payer: Self-pay | Admitting: Internal Medicine

## 2019-09-08 ENCOUNTER — Telehealth (INDEPENDENT_AMBULATORY_CARE_PROVIDER_SITE_OTHER): Payer: BC Managed Care – PPO | Admitting: Internal Medicine

## 2019-09-08 VITALS — BP 117/76 | Ht 72.0 in | Wt 204.0 lb

## 2019-09-08 DIAGNOSIS — I4819 Other persistent atrial fibrillation: Secondary | ICD-10-CM | POA: Diagnosis not present

## 2019-09-08 DIAGNOSIS — I2510 Atherosclerotic heart disease of native coronary artery without angina pectoris: Secondary | ICD-10-CM | POA: Diagnosis not present

## 2019-09-08 NOTE — Progress Notes (Signed)
Electrophysiology TeleHealth Note  Due to national recommendations of social distancing due to [place] 19, an audio telehealth visit is felt to be most appropriate for this patient at this time.  Verbal consent was obtained by me for the telehealth visit today.  The patient does not have capability for a virtual visit.  A phone visit is therefore required today.   Date:  09/08/2019   ID:  [name], DOB 10-18-1960, MRN [id number]  Location: patient's home  Provider location:  [facility]  Evaluation Performed: Follow-up visit  PCP:  Starling Manns, MD   Electrophysiologist:  Dr Rayann Heman  Chief Complaint:  palpitations  History of Present Illness:    [name] is a 59 y.o. male who presents via telehealth conferencing today.  Since last being seen in our clinic, the patient reports doing very well.  Today, he denies symptoms of palpitations, chest pain, shortness of breath,  lower extremity edema, dizziness, presyncope, or syncope.  The patient is otherwise without complaint today.  The patient denies symptoms of fevers, chills, cough, or new SOB worrisome for COVID 19.  Past Medical History:  Diagnosis Date  . Atrial fibrillation ([place])    persistent  . Cerebrovascular disease   . Coronary artery disease 2006   s/p PCI x3   . Diabetes mellitus    resolved with weight loss  . Hyperlipidemia   . Hypertension    resolved with weight loss  . Morbid obesity ([place])    prior, has lost 120 lbs w/ lifestyle modification  . OSA (obstructive sleep apnea)    improved with weight loss    Past Surgical History:  Procedure Laterality Date  . ATRIAL ABLATION SURGERY  02/08/09 and 07/21/10  . EYE SURGERY     at 55 months of age    Current Outpatient Medications  Medication Sig Dispense Refill  . aspirin 81 MG EC tablet Take 81 mg by mouth daily.      . carvedilol (COREG) 12.5 MG tablet TAKE 1 TABLET BY MOUTH TWICE DAILY WITH MEALS 180 tablet 3  . lisinopril  (PRINIVIL,ZESTRIL) 5 MG tablet TAKE 1 TABLET BY MOUTH ONCE DAILY 90 tablet 3  . Multiple Vitamins-Iron (MULTIVITAMIN/IRON) TABS Take 1 tablet by mouth daily.     . nitroGLYCERIN (NITROSTAT) 0.4 MG SL tablet DISSOLVE ONE TABLET UNDER THE TONGUE EVERY 5 MINUTES AS NEEDED FOR CHEST PAIN.DO NOT EXCEED A TOTAL OF 3 DOSES IN 15 MINUTES. Please make yearly appt with Dr. Rayann Heman.for December. 1st attempt 75 tablet 3  . Omega-3 Fatty Acids (FISH OIL) 1000 MG CAPS Take 2 capsules by mouth daily.     . simvastatin (ZOCOR) 20 MG tablet TAKE 1 TABLET BY MOUTH ONCE DAILY 90 tablet 3  . fluticasone (FLONASE) 50 MCG/ACT nasal spray Place 2 sprays into the nose daily as needed. congestion     No current facility-administered medications for this visit.    Allergies:   Patient has no known allergies.   Social History:  The patient  reports that he has never smoked. He has never used smokeless tobacco. He reports that he does not drink alcohol or use drugs.   Family History:  The patient's family history includes Dementia in his mother.   ROS:  Please see the history of present illness.   All other systems are personally reviewed and negative.    Exam:    Vital Signs:  BP 117/76   Ht 6' (1.829 m)   [employer] [facility]  204 lb (92.5 kg)   BMI 27.67 kg/m   Well sounding, alert and conversant   Labs/Other Tests and Data Reviewed:    Recent Labs: No results found for requested labs within last 8760 hours.   Wt Readings from Last 3 Encounters:  09/08/19 204 lb (92.5 kg)  08/19/18 228 lb (103.4 kg)  08/22/17 213 lb 3.2 oz (96.7 kg)     ASSESSMENT & PLAN:    1.  Persistent atrial fibrillation chads2vasc score is 1.   He does not require anticoagulation Doing well, without afib post ablation  2. CAD No iscemic symptoms No changes today He will request labs from 11/2018 to be sent to me from PCP  Follow-up:  12 months with me   Patient Risk:  after full review of this patients clinical status, I feel that they  are at moderate risk at this time.  Today, I have spent 15 minutes with the patient with telehealth technology discussing arrhythmia management .    Randolm Idol, MD  09/08/2019 8:49 AM     [facility] [address] [place] [address] [phone] (office) [phone] (fax)

## 2019-10-20 DIAGNOSIS — R05 Cough: Secondary | ICD-10-CM | POA: Diagnosis not present

## 2019-10-20 DIAGNOSIS — Z03818 Encounter for observation for suspected exposure to other biological agents ruled out: Secondary | ICD-10-CM | POA: Diagnosis not present

## 2019-10-20 DIAGNOSIS — J069 Acute upper respiratory infection, unspecified: Secondary | ICD-10-CM | POA: Diagnosis not present

## 2019-10-29 DIAGNOSIS — U071 COVID-19: Secondary | ICD-10-CM | POA: Diagnosis not present

## 2019-11-01 ENCOUNTER — Other Ambulatory Visit: Payer: Self-pay | Admitting: Internal Medicine

## 2019-11-20 ENCOUNTER — Ambulatory Visit: Payer: BC Managed Care – PPO

## 2019-11-21 ENCOUNTER — Ambulatory Visit: Payer: BC Managed Care – PPO | Attending: Internal Medicine

## 2019-11-21 DIAGNOSIS — Z23 Encounter for immunization: Secondary | ICD-10-CM

## 2019-11-21 NOTE — Progress Notes (Signed)
   Covid-19 Vaccination Clinic  Name:  WILBERT HAYASHI    MRN: [id number] DOB: February 09, 1961  11/21/2019  Mr. Carley was observed post Covid-19 immunization for 15 minutes without incident. He was provided with Vaccine Information Sheet and instruction to access the V-Safe system.   Mr. Leppo was instructed to call 911 with any severe reactions post vaccine: Marland Kitchen Difficulty breathing  . Swelling of face and throat  . A fast heartbeat  . A bad rash all over body  . Dizziness and weakness   Immunizations Administered    Name Date Dose VIS Date Route   Pfizer COVID-19 Vaccine 11/21/2019  8:18 AM 0.3 mL 08/15/2019 Intramuscular   Manufacturer: [employer], [employer]   Lot: [id number]   NDC: [address]-4

## 2019-12-16 ENCOUNTER — Ambulatory Visit: Payer: Self-pay | Attending: Internal Medicine

## 2019-12-16 DIAGNOSIS — Z23 Encounter for immunization: Secondary | ICD-10-CM

## 2019-12-16 NOTE — Progress Notes (Signed)
   Covid-19 Vaccination Clinic  Name:  DELDRICK [name]    MRN: [id number] DOB: 12/25/1960  12/16/2019  [name] was observed post Covid-19 immunization for 15 minutes without incident. He was provided with Vaccine Information Sheet and instruction to access the V-Safe system.   [name] was instructed to call 911 with any severe reactions post vaccine: Marland Kitchen Difficulty breathing  . Swelling of face and throat  . A fast heartbeat  . A bad rash all over body  . Dizziness and weakness   Immunizations Administered    Name Date Dose VIS Date Route   Pfizer COVID-19 Vaccine 12/16/2019  8:17 AM 0.3 mL 08/15/2019 Intramuscular   Manufacturer: [employer], [employer]   Lot: [id number]   NDC: [address]-9

## 2020-01-19 DIAGNOSIS — E782 Mixed hyperlipidemia: Secondary | ICD-10-CM | POA: Diagnosis not present

## 2020-01-19 DIAGNOSIS — Z79899 Other long term (current) drug therapy: Secondary | ICD-10-CM | POA: Diagnosis not present

## 2020-01-19 DIAGNOSIS — E6609 Other obesity due to excess calories: Secondary | ICD-10-CM | POA: Diagnosis not present

## 2020-01-19 DIAGNOSIS — Z125 Encounter for screening for malignant neoplasm of prostate: Secondary | ICD-10-CM | POA: Diagnosis not present

## 2020-01-21 DIAGNOSIS — E6609 Other obesity due to excess calories: Secondary | ICD-10-CM | POA: Diagnosis not present

## 2020-01-21 DIAGNOSIS — E782 Mixed hyperlipidemia: Secondary | ICD-10-CM | POA: Diagnosis not present

## 2020-01-21 DIAGNOSIS — I119 Hypertensive heart disease without heart failure: Secondary | ICD-10-CM | POA: Diagnosis not present

## 2020-01-21 DIAGNOSIS — Z0001 Encounter for general adult medical examination with abnormal findings: Secondary | ICD-10-CM | POA: Diagnosis not present

## 2020-03-11 DIAGNOSIS — D120 Benign neoplasm of cecum: Secondary | ICD-10-CM | POA: Diagnosis not present

## 2020-03-11 DIAGNOSIS — K635 Polyp of colon: Secondary | ICD-10-CM | POA: Diagnosis not present

## 2020-03-11 DIAGNOSIS — Z1211 Encounter for screening for malignant neoplasm of colon: Secondary | ICD-10-CM | POA: Diagnosis not present

## 2020-03-11 DIAGNOSIS — K640 First degree hemorrhoids: Secondary | ICD-10-CM | POA: Diagnosis not present

## 2020-08-26 ENCOUNTER — Other Ambulatory Visit: Payer: Self-pay

## 2020-08-26 ENCOUNTER — Telehealth: Payer: Self-pay | Admitting: Internal Medicine

## 2020-08-26 ENCOUNTER — Encounter: Payer: Self-pay | Admitting: Internal Medicine

## 2020-08-26 ENCOUNTER — Ambulatory Visit: Payer: BC Managed Care – PPO | Admitting: Internal Medicine

## 2020-08-26 VITALS — BP 132/80 | HR 77 | Ht 72.0 in | Wt 250.6 lb

## 2020-08-26 DIAGNOSIS — I2510 Atherosclerotic heart disease of native coronary artery without angina pectoris: Secondary | ICD-10-CM | POA: Diagnosis not present

## 2020-08-26 DIAGNOSIS — I10 Essential (primary) hypertension: Secondary | ICD-10-CM | POA: Diagnosis not present

## 2020-08-26 DIAGNOSIS — I480 Paroxysmal atrial fibrillation: Secondary | ICD-10-CM | POA: Diagnosis not present

## 2020-08-26 MED ORDER — NITROGLYCERIN 0.4 MG SL SUBL: SUBLINGUAL_TABLET | SUBLINGUAL | 3 refills | Status: DC

## 2020-08-26 NOTE — Progress Notes (Signed)
PCP: Cassie Freer, MD   Primary EP: Dr Andre Lefort Feng is a 59 y.o. male who presents today for routine electrophysiology followup.  Since last being seen in our clinic, the patient reports doing very well.  Today, he denies symptoms of palpitations, chest pain, shortness of breath,  lower extremity edema, dizziness, presyncope, or syncope.  The patient is otherwise without complaint today.   Past Medical History:  Diagnosis Date  . Atrial fibrillation (HCC)    persistent  . Cerebrovascular disease   . Coronary artery disease 2006   s/p PCI x3   . Diabetes mellitus    resolved with weight loss  . Hyperlipidemia   . Hypertension    resolved with weight loss  . Morbid obesity (HCC)    prior, has lost 120 lbs w/ lifestyle modification  . OSA (obstructive sleep apnea)    improved with weight loss   Past Surgical History:  Procedure Laterality Date  . ATRIAL ABLATION SURGERY  02/08/09 and 07/21/10  . EYE SURGERY     at 48 months of age    ROS- all systems are reviewed and negatives except as per HPI above  Current Outpatient Medications  Medication Sig Dispense Refill  . aspirin 81 MG EC tablet Take 81 mg by mouth daily.    . carvedilol (COREG) 12.5 MG tablet TAKE 1 TABLET BY MOUTH TWICE DAILY WITH MEALS 180 tablet 2  . lisinopril (ZESTRIL) 5 MG tablet Take 1 tablet by mouth once daily 90 tablet 2  . Multiple Vitamins-Iron (MULTIVITAMIN/IRON) TABS Take 1 tablet by mouth daily.    . nitroGLYCERIN (NITROSTAT) 0.4 MG SL tablet DISSOLVE ONE TABLET UNDER THE TONGUE EVERY 5 MINUTES AS NEEDED FOR CHEST PAIN.DO NOT EXCEED A TOTAL OF 3 DOSES IN 15 MINUTES. Please make yearly appt with Dr. [name].for December. 1st attempt 75 tablet 3  . Omega-3 Fatty Acids (FISH OIL) 1000 MG CAPS Take 2 capsules by mouth daily.    . simvastatin (ZOCOR) 20 MG tablet Take 1 tablet by mouth once daily 90 tablet 2  . fluticasone (FLONASE) 50 MCG/ACT nasal spray Place 2 sprays into the nose daily  as needed. congestion     No current facility-administered medications for this visit.    Physical Exam: Vitals:   08/26/20 1557  BP: 132/80  Pulse: 77  SpO2: 93%  Weight: 250 lb 9.6 oz (113.7 kg)  Height: 6' (1.829 m)    GEN- The patient is well appearing, alert and oriented x 3 today.   Head- normocephalic, atraumatic Eyes-  Sclera clear, conjunctiva pink Ears- hearing intact Oropharynx- clear Lungs- Clear to ausculation bilaterally, normal work of breathing Heart- Regular rate and rhythm, no murmurs, rubs or gallops, PMI not laterally displaced GI- soft, NT, ND, + BS Extremities- no clubbing, cyanosis, or edema  Wt Readings from Last 3 Encounters:  08/26/20 250 lb 9.6 oz (113.7 kg)  09/08/19 204 lb (92.5 kg)  08/19/18 228 lb (103.4 kg)    EKG tracing ordered today is personally reviewed and shows sinus  Assessment and Plan:  1. Paroxysmal atrial fibrillation Well controlled post ablation chads2vasc score is 1.  Does not require OAC therapy  2. CAD No ischemic symptoms No changes Echo to evaluate for structural changes  3. HTN Echo as above  4. Overweight Body mass index is 33.99 kg/m. Lifestyle modification was discussed at length today  Risks, benefits and potential toxicities for medications prescribed and/or refilled reviewed with patient today.  Return in a year  Hillis Range MD, [facility] 08/26/2020 4:11 PM

## 2020-08-26 NOTE — Telephone Encounter (Signed)
Pt states no message was left. I advised him his appt was still scheduled. He confirmed he will be in today.

## 2020-08-26 NOTE — Patient Instructions (Addendum)
Medication Instructions:  Your physician recommends that you continue on your current medications as directed. Please refer to the Current Medication list given to you today.  Labwork: None ordered.  Testing/Procedures: Your physician has requested that you have an echocardiogram. Echocardiography is a painless test that uses sound waves to create images of your heart. It provides your doctor with information about the size and shape of your heart and how well your heart's chambers and valves are working. This procedure takes approximately one hour. There are no restrictions for this procedure.  Please schedule for ECHO in February  Follow-Up: Your physician wants you to follow-up in: one year with Dr. [name].  You will receive a reminder letter in the mail two months in advance. If you don't receive a letter, please call our office to schedule the follow-up appointment.  Any Other Special Instructions Will Be Listed Below (If Applicable).  If you need a refill on your cardiac medications before your next appointment, please call your pharmacy.

## 2020-08-26 NOTE — Telephone Encounter (Signed)
[name] is calling stating he received a call from our office, but I was unable to find documentation in regards to it. Patient has an appt scheduled for 3:45 Pm today with Dr. [name] and didn't know if it could be due to that. Please advise.

## 2020-08-31 DIAGNOSIS — H6123 Impacted cerumen, bilateral: Secondary | ICD-10-CM | POA: Diagnosis not present

## 2020-09-23 DIAGNOSIS — J019 Acute sinusitis, unspecified: Secondary | ICD-10-CM | POA: Diagnosis not present

## 2020-09-23 DIAGNOSIS — Z03818 Encounter for observation for suspected exposure to other biological agents ruled out: Secondary | ICD-10-CM | POA: Diagnosis not present

## 2020-10-11 ENCOUNTER — Other Ambulatory Visit: Payer: Self-pay

## 2020-10-11 ENCOUNTER — Ambulatory Visit (HOSPITAL_COMMUNITY): Payer: BC Managed Care – PPO | Attending: Cardiovascular Disease

## 2020-10-11 DIAGNOSIS — I2510 Atherosclerotic heart disease of native coronary artery without angina pectoris: Secondary | ICD-10-CM | POA: Diagnosis not present

## 2020-10-11 DIAGNOSIS — I10 Essential (primary) hypertension: Secondary | ICD-10-CM | POA: Diagnosis not present

## 2020-10-11 LAB — ECHOCARDIOGRAM COMPLETE
Area-P 1/2: 2.83 cm2
S' Lateral: 3.3 cm

## 2020-10-20 DIAGNOSIS — H9313 Tinnitus, bilateral: Secondary | ICD-10-CM | POA: Diagnosis not present

## 2020-10-20 DIAGNOSIS — H6123 Impacted cerumen, bilateral: Secondary | ICD-10-CM | POA: Diagnosis not present

## 2020-10-20 DIAGNOSIS — H903 Sensorineural hearing loss, bilateral: Secondary | ICD-10-CM | POA: Diagnosis not present

## 2020-12-01 DIAGNOSIS — Z03818 Encounter for observation for suspected exposure to other biological agents ruled out: Secondary | ICD-10-CM | POA: Diagnosis not present

## 2020-12-01 DIAGNOSIS — J069 Acute upper respiratory infection, unspecified: Secondary | ICD-10-CM | POA: Diagnosis not present

## 2021-01-19 DIAGNOSIS — I119 Hypertensive heart disease without heart failure: Secondary | ICD-10-CM | POA: Diagnosis not present

## 2021-01-19 DIAGNOSIS — E782 Mixed hyperlipidemia: Secondary | ICD-10-CM | POA: Diagnosis not present

## 2021-01-19 DIAGNOSIS — E6609 Other obesity due to excess calories: Secondary | ICD-10-CM | POA: Diagnosis not present

## 2021-01-19 DIAGNOSIS — Z125 Encounter for screening for malignant neoplasm of prostate: Secondary | ICD-10-CM | POA: Diagnosis not present

## 2021-01-21 DIAGNOSIS — I119 Hypertensive heart disease without heart failure: Secondary | ICD-10-CM | POA: Diagnosis not present

## 2021-01-21 DIAGNOSIS — Z0100 Encounter for examination of eyes and vision without abnormal findings: Secondary | ICD-10-CM | POA: Diagnosis not present

## 2021-01-21 DIAGNOSIS — I2510 Atherosclerotic heart disease of native coronary artery without angina pectoris: Secondary | ICD-10-CM | POA: Diagnosis not present

## 2021-01-21 DIAGNOSIS — E782 Mixed hyperlipidemia: Secondary | ICD-10-CM | POA: Diagnosis not present

## 2021-02-22 DIAGNOSIS — R110 Nausea: Secondary | ICD-10-CM | POA: Diagnosis not present

## 2021-02-22 DIAGNOSIS — R197 Diarrhea, unspecified: Secondary | ICD-10-CM | POA: Diagnosis not present

## 2021-03-27 DIAGNOSIS — S20219A Contusion of unspecified front wall of thorax, initial encounter: Secondary | ICD-10-CM | POA: Diagnosis not present

## 2021-05-12 ENCOUNTER — Other Ambulatory Visit: Payer: Self-pay | Admitting: Internal Medicine

## 2021-05-31 DIAGNOSIS — J069 Acute upper respiratory infection, unspecified: Secondary | ICD-10-CM | POA: Diagnosis not present

## 2021-05-31 DIAGNOSIS — Z03818 Encounter for observation for suspected exposure to other biological agents ruled out: Secondary | ICD-10-CM | POA: Diagnosis not present

## 2021-06-23 DIAGNOSIS — J019 Acute sinusitis, unspecified: Secondary | ICD-10-CM | POA: Diagnosis not present

## 2021-06-28 DIAGNOSIS — I119 Hypertensive heart disease without heart failure: Secondary | ICD-10-CM | POA: Diagnosis not present

## 2021-06-28 DIAGNOSIS — I2510 Atherosclerotic heart disease of native coronary artery without angina pectoris: Secondary | ICD-10-CM | POA: Diagnosis not present

## 2021-06-28 DIAGNOSIS — E6609 Other obesity due to excess calories: Secondary | ICD-10-CM | POA: Diagnosis not present

## 2021-06-28 DIAGNOSIS — E782 Mixed hyperlipidemia: Secondary | ICD-10-CM | POA: Diagnosis not present

## 2021-07-18 DIAGNOSIS — J019 Acute sinusitis, unspecified: Secondary | ICD-10-CM | POA: Diagnosis not present

## 2021-07-18 DIAGNOSIS — I119 Hypertensive heart disease without heart failure: Secondary | ICD-10-CM | POA: Diagnosis not present

## 2021-07-18 DIAGNOSIS — I2510 Atherosclerotic heart disease of native coronary artery without angina pectoris: Secondary | ICD-10-CM | POA: Diagnosis not present

## 2021-07-18 DIAGNOSIS — E782 Mixed hyperlipidemia: Secondary | ICD-10-CM | POA: Diagnosis not present

## 2021-08-17 DIAGNOSIS — I119 Hypertensive heart disease without heart failure: Secondary | ICD-10-CM | POA: Diagnosis not present

## 2021-08-17 DIAGNOSIS — J329 Chronic sinusitis, unspecified: Secondary | ICD-10-CM | POA: Diagnosis not present

## 2021-08-22 ENCOUNTER — Ambulatory Visit: Payer: BC Managed Care – PPO | Admitting: Internal Medicine

## 2021-08-22 ENCOUNTER — Other Ambulatory Visit: Payer: Self-pay

## 2021-08-22 ENCOUNTER — Encounter: Payer: Self-pay | Admitting: Internal Medicine

## 2021-08-22 VITALS — BP 128/78 | HR 87 | Ht 72.0 in | Wt 253.4 lb

## 2021-08-22 DIAGNOSIS — I480 Paroxysmal atrial fibrillation: Secondary | ICD-10-CM

## 2021-08-22 DIAGNOSIS — I2510 Atherosclerotic heart disease of native coronary artery without angina pectoris: Secondary | ICD-10-CM

## 2021-08-22 DIAGNOSIS — I10 Essential (primary) hypertension: Secondary | ICD-10-CM

## 2021-08-22 NOTE — Progress Notes (Signed)
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  Since last being seen in our clinic, the patient reports doing very well.  Today, he denies symptoms of palpitations, chest pain, shortness of breath,  lower extremity edema, dizziness, presyncope, or syncope.  The patient is otherwise without complaint today.   Past Medical History:  Diagnosis Date   Atrial fibrillation (HCC)    persistent   Cerebrovascular disease    Coronary artery disease 2006   s/p PCI x3    Diabetes mellitus    resolved with weight loss   Hyperlipidemia    Hypertension    resolved with weight loss   Morbid obesity (HCC)    prior, has lost 120 lbs w/ lifestyle modification   OSA (obstructive sleep apnea)    improved with weight loss   Past Surgical History:  Procedure Laterality Date   ATRIAL ABLATION SURGERY  02/08/09 and 07/21/10   EYE SURGERY     at 82 months of age    ROS- all systems are reviewed and negatives except as per HPI above  Current Outpatient Medications  Medication Sig Dispense Refill   aspirin 81 MG EC tablet Take 81 mg by mouth daily.     carvedilol (COREG) 12.5 MG tablet TAKE 1 TABLET BY MOUTH TWICE DAILY WITH MEALS 180 tablet 0   fluticasone (FLONASE) 50 MCG/ACT nasal spray Place 2 sprays into the nose daily as needed. congestion     lisinopril (ZESTRIL) 5 MG tablet Take 1 tablet by mouth once daily 90 tablet 0   Multiple Vitamins-Iron (MULTIVITAMIN/IRON) TABS Take 1 tablet by mouth daily.     nitroGLYCERIN (NITROSTAT) 0.4 MG SL tablet DISSOLVE ONE TABLET UNDER THE TONGUE EVERY 5 MINUTES AS NEEDED FOR CHEST PAIN.DO NOT EXCEED A TOTAL OF 3 DOSES IN 15 MINUTES. 75 tablet 3   Omega-3 Fatty Acids (FISH OIL) 1000 MG CAPS Take 2 capsules by mouth daily.     simvastatin (ZOCOR) 20 MG tablet Take 1 tablet by mouth once daily 90 tablet 0   traZODone (DESYREL) 50 MG tablet Take 50 mg by mouth as  needed for sleep.     No current facility-administered medications for this visit.    Physical Exam: Vitals:   08/22/21 0931  BP: 128/78  Pulse: 87  SpO2: 95%  Weight: 253 lb 6.4 oz (114.9 kg)  Height: 6' (1.829 m)    GEN- The patient is well appearing, alert and oriented x 3 today.   Head- normocephalic, atraumatic Eyes-  Sclera clear, conjunctiva pink Ears- hearing intact Oropharynx- clear Lungs- Clear to ausculation bilaterally, normal work of breathing Heart- Regular rate and rhythm, no murmurs, rubs or gallops, PMI not laterally displaced GI- soft, NT, ND, + BS Extremities- no clubbing, cyanosis, or edema  Wt Readings from Last 3 Encounters:  08/22/21 253 lb 6.4 oz (114.9 kg)  08/26/20 250 lb 9.6 oz (113.7 kg)  09/08/19 204 lb (92.5 kg)   Echo 10/11/20- EF 55%  EKG tracing ordered today is personally reviewed and shows sinus  Assessment and Plan:  Paroxysmal atrial fibrillation Well controlled post ablation Chads2vasc score is 1.  Does not require OAC  2. HTN Stable No change required today  3. CAD No ischemic symptoms Continue simvastatin 20mg  daily  4. Overweight Body mass index is 34.37 kg/m. Lifestyle modification advised  Return to see EP APP in a year  John Range MD, [facility] 08/22/2021 9:42 AM

## 2021-08-22 NOTE — Patient Instructions (Addendum)
Medication Instructions:  Your physician recommends that you continue on your current medications as directed. Please refer to the Current Medication list given to you today. *If you need a refill on your cardiac medications before your next appointment, please call your pharmacy*  Lab Work: None. If you have labs (blood work) drawn today and your tests are completely normal, you will receive your results only by: MyChart Message (if you have MyChart) OR A paper copy in the mail If you have any lab test that is abnormal or we need to change your treatment, we will call you to review the results.  Testing/Procedures: None.  Follow-Up: At [facility], you and your health needs are our priority.  As part of our continuing mission to provide you with exceptional heart care, we have created designated Provider Care Teams.  These Care Teams include your primary Cardiologist (physician) and Advanced Practice Providers (APPs -  Physician Assistants and Nurse Practitioners) who all work together to provide you with the care you need, when you need it.  Your physician wants you to follow-up in: 12 months with    one of the following Advanced Practice Providers on your designated Care Team:    Renee Ursuy, PA-C    You will receive a reminder letter in the mail two months in advance. If you don't receive a letter, please call our office to schedule the follow-up appointment.  We recommend signing up for the patient portal called "MyChart".  Sign up information is provided on this After Visit Summary.  MyChart is used to connect with patients for Virtual Visits (Telemedicine).  Patients are able to view lab/test results, encounter notes, upcoming appointments, etc.  Non-urgent messages can be sent to your provider as well.   To learn more about what you can do with MyChart, go to [email or web address].    Any Other Special Instructions Will Be Listed Below (If Applicable).         

## 2021-10-04 DIAGNOSIS — R197 Diarrhea, unspecified: Secondary | ICD-10-CM | POA: Diagnosis not present

## 2021-10-06 DIAGNOSIS — R051 Acute cough: Secondary | ICD-10-CM | POA: Diagnosis not present

## 2021-10-06 DIAGNOSIS — I119 Hypertensive heart disease without heart failure: Secondary | ICD-10-CM | POA: Diagnosis not present

## 2021-10-06 DIAGNOSIS — J019 Acute sinusitis, unspecified: Secondary | ICD-10-CM | POA: Diagnosis not present

## 2021-10-06 DIAGNOSIS — I2510 Atherosclerotic heart disease of native coronary artery without angina pectoris: Secondary | ICD-10-CM | POA: Diagnosis not present

## 2021-10-17 ENCOUNTER — Other Ambulatory Visit: Payer: Self-pay | Admitting: Internal Medicine

## 2021-10-17 MED ORDER — NITROGLYCERIN 0.4 MG SL SUBL: SUBLINGUAL_TABLET | SUBLINGUAL | 3 refills | Status: AC

## 2021-11-16 DIAGNOSIS — H6123 Impacted cerumen, bilateral: Secondary | ICD-10-CM | POA: Diagnosis not present

## 2021-11-16 DIAGNOSIS — J329 Chronic sinusitis, unspecified: Secondary | ICD-10-CM | POA: Diagnosis not present

## 2021-11-16 DIAGNOSIS — J342 Deviated nasal septum: Secondary | ICD-10-CM | POA: Diagnosis not present

## 2021-11-17 DIAGNOSIS — Z20822 Contact with and (suspected) exposure to covid-19: Secondary | ICD-10-CM | POA: Diagnosis not present

## 2021-11-17 DIAGNOSIS — I119 Hypertensive heart disease without heart failure: Secondary | ICD-10-CM | POA: Diagnosis not present

## 2021-11-17 DIAGNOSIS — F0630 Mood disorder due to known physiological condition, unspecified: Secondary | ICD-10-CM | POA: Diagnosis not present

## 2021-11-17 DIAGNOSIS — R6889 Other general symptoms and signs: Secondary | ICD-10-CM | POA: Diagnosis not present

## 2021-11-28 DIAGNOSIS — J3089 Other allergic rhinitis: Secondary | ICD-10-CM | POA: Diagnosis not present

## 2021-11-29 DIAGNOSIS — I2510 Atherosclerotic heart disease of native coronary artery without angina pectoris: Secondary | ICD-10-CM | POA: Diagnosis not present

## 2021-11-29 DIAGNOSIS — E782 Mixed hyperlipidemia: Secondary | ICD-10-CM | POA: Diagnosis not present

## 2021-11-29 DIAGNOSIS — F411 Generalized anxiety disorder: Secondary | ICD-10-CM | POA: Diagnosis not present

## 2021-11-29 DIAGNOSIS — I119 Hypertensive heart disease without heart failure: Secondary | ICD-10-CM | POA: Diagnosis not present

## 2022-01-02 DIAGNOSIS — J329 Chronic sinusitis, unspecified: Secondary | ICD-10-CM | POA: Diagnosis not present

## 2022-01-02 DIAGNOSIS — J309 Allergic rhinitis, unspecified: Secondary | ICD-10-CM | POA: Diagnosis not present

## 2022-01-12 DIAGNOSIS — R197 Diarrhea, unspecified: Secondary | ICD-10-CM | POA: Diagnosis not present

## 2022-01-21 ENCOUNTER — Other Ambulatory Visit: Payer: Self-pay | Admitting: Internal Medicine

## 2022-01-23 DIAGNOSIS — Z1211 Encounter for screening for malignant neoplasm of colon: Secondary | ICD-10-CM | POA: Diagnosis not present

## 2022-01-23 DIAGNOSIS — E1169 Type 2 diabetes mellitus with other specified complication: Secondary | ICD-10-CM | POA: Diagnosis not present

## 2022-01-23 DIAGNOSIS — Z0001 Encounter for general adult medical examination with abnormal findings: Secondary | ICD-10-CM | POA: Diagnosis not present

## 2022-01-24 ENCOUNTER — Other Ambulatory Visit: Payer: Self-pay | Admitting: *Deleted

## 2022-01-24 MED ORDER — SIMVASTATIN 20 MG PO TABS: 20.0000 mg | ORAL_TABLET | Freq: Every day | ORAL | 2 refills | Status: DC

## 2022-01-26 DIAGNOSIS — R112 Nausea with vomiting, unspecified: Secondary | ICD-10-CM | POA: Diagnosis not present

## 2022-01-26 DIAGNOSIS — R0982 Postnasal drip: Secondary | ICD-10-CM | POA: Diagnosis not present

## 2022-01-28 ENCOUNTER — Other Ambulatory Visit: Payer: Self-pay | Admitting: Internal Medicine

## 2022-02-03 DIAGNOSIS — E782 Mixed hyperlipidemia: Secondary | ICD-10-CM | POA: Diagnosis not present

## 2022-02-03 DIAGNOSIS — R5383 Other fatigue: Secondary | ICD-10-CM | POA: Diagnosis not present

## 2022-02-03 DIAGNOSIS — E1169 Type 2 diabetes mellitus with other specified complication: Secondary | ICD-10-CM | POA: Diagnosis not present

## 2022-02-03 DIAGNOSIS — Z125 Encounter for screening for malignant neoplasm of prostate: Secondary | ICD-10-CM | POA: Diagnosis not present

## 2022-02-09 DIAGNOSIS — L820 Inflamed seborrheic keratosis: Secondary | ICD-10-CM | POA: Diagnosis not present

## 2022-02-13 DIAGNOSIS — J301 Allergic rhinitis due to pollen: Secondary | ICD-10-CM | POA: Diagnosis not present

## 2022-04-11 DIAGNOSIS — J22 Unspecified acute lower respiratory infection: Secondary | ICD-10-CM | POA: Diagnosis not present

## 2022-04-11 DIAGNOSIS — R051 Acute cough: Secondary | ICD-10-CM | POA: Diagnosis not present

## 2022-04-11 DIAGNOSIS — R062 Wheezing: Secondary | ICD-10-CM | POA: Diagnosis not present

## 2022-05-31 DIAGNOSIS — U071 COVID-19: Secondary | ICD-10-CM | POA: Diagnosis not present

## 2022-08-08 DIAGNOSIS — R0981 Nasal congestion: Secondary | ICD-10-CM | POA: Diagnosis not present

## 2022-08-08 DIAGNOSIS — B349 Viral infection, unspecified: Secondary | ICD-10-CM | POA: Diagnosis not present

## 2022-08-10 ENCOUNTER — Other Ambulatory Visit: Payer: Self-pay | Admitting: Internal Medicine

## 2022-08-10 NOTE — Telephone Encounter (Signed)
Rx refill sent to pharmacy. 

## 2022-08-22 ENCOUNTER — Ambulatory Visit: Payer: BC Managed Care – PPO | Admitting: Cardiovascular Disease

## 2022-08-22 NOTE — Progress Notes (Deleted)
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 male who presents today for routine electrophysiology followup.  Since last being seen in our clinic, the patient reports doing very well.  Today, he denies symptoms of palpitations, chest pain, shortness of breath,  lower extremity edema, dizziness, presyncope, or syncope.  The patient is otherwise without complaint today.   Past Medical History:  Diagnosis Date   Atrial fibrillation (HCC)    persistent   Cerebrovascular disease    Coronary artery disease 2006   s/p PCI x3    Diabetes mellitus    resolved with weight loss   Hyperlipidemia    Hypertension    resolved with weight loss   Morbid obesity (HCC)    prior, has lost 120 lbs w/ lifestyle modification   OSA (obstructive sleep apnea)    improved with weight loss   Past Surgical History:  Procedure Laterality Date   ATRIAL ABLATION SURGERY  02/08/09 and 07/21/10   EYE SURGERY     at 16 months of age    ROS- all systems are reviewed and negatives except as per HPI above  Current Outpatient Medications  Medication Sig Dispense Refill   aspirin 81 MG EC tablet Take 81 mg by mouth daily.     carvedilol (COREG) 12.5 MG tablet TAKE 1 TABLET BY MOUTH TWICE DAILY WITH MEALS 180 tablet 2   fluticasone (FLONASE) 50 MCG/ACT nasal spray Place 2 sprays into the nose daily as needed. congestion     lisinopril (ZESTRIL) 5 MG tablet Take 1 tablet (5 mg total) by mouth daily. 90 tablet 0   Multiple Vitamins-Iron (MULTIVITAMIN/IRON) TABS Take 1 tablet by mouth daily.     nitroGLYCERIN (NITROSTAT) 0.4 MG SL tablet DISSOLVE ONE TABLET UNDER THE TONGUE EVERY 5 MINUTES AS NEEDED FOR CHEST PAIN.DO NOT EXCEED A TOTAL OF 3 DOSES IN 15 MINUTES. 75 tablet 3   Omega-3 Fatty Acids (FISH OIL) 1000 MG CAPS Take 2 capsules by mouth daily.     simvastatin (ZOCOR) 20 MG tablet Take 1 tablet (20 mg total) by mouth daily. 90 tablet 2   traZODone (DESYREL) 50 MG tablet Take  50 mg by mouth as needed for sleep.     No current facility-administered medications for this visit.    Physical Exam: There were no vitals filed for this visit.   GEN- The patient is well appearing, alert and oriented x 3 today.   Head- normocephalic, atraumatic Eyes-  Sclera clear, conjunctiva pink Ears- hearing intact Oropharynx- clear Lungs- Clear to ausculation bilaterally, normal work of breathing Heart- Regular rate and rhythm, no murmurs, rubs or gallops, PMI not laterally displaced GI- soft, NT, ND, + BS Extremities- no clubbing, cyanosis, or edema  Wt Readings from Last 3 Encounters:  08/22/21 253 lb 6.4 oz (114.9 kg)  08/26/20 250 lb 9.6 oz (113.7 kg)  09/08/19 204 lb (92.5 kg)   Echo 10/11/20- EF 55%  EKG tracing ordered today is personally reviewed and shows sinus  Assessment and Plan:  Paroxysmal atrial fibrillation Well controlled post ablation Chads2vasc score is 2.    2. HTN Stable No change required today  3. 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 Overweight There is no height or weight on file to calculate BMI. Lifestyle modification advised  Return to see EP APP in a year  ,  MD 08/22/2022 3:53 PM

## 2022-09-13 DIAGNOSIS — J00 Acute nasopharyngitis [common cold]: Secondary | ICD-10-CM | POA: Diagnosis not present

## 2022-09-13 DIAGNOSIS — Z03818 Encounter for observation for suspected exposure to other biological agents ruled out: Secondary | ICD-10-CM | POA: Diagnosis not present

## 2022-09-18 ENCOUNTER — Ambulatory Visit: Payer: BC Managed Care – PPO | Admitting: Cardiovascular Disease

## 2022-11-02 ENCOUNTER — Encounter: Payer: Self-pay | Admitting: Cardiovascular Disease

## 2022-11-02 ENCOUNTER — Ambulatory Visit: Payer: BC Managed Care – PPO | Attending: Cardiovascular Disease | Admitting: Cardiovascular Disease

## 2022-11-02 VITALS — BP 126/80 | HR 89 | Ht 72.0 in | Wt 258.0 lb

## 2022-11-02 DIAGNOSIS — I480 Paroxysmal atrial fibrillation: Secondary | ICD-10-CM

## 2022-11-02 NOTE — Patient Instructions (Signed)
Medication Instructions:  Your physician recommends that you continue on your current medications as directed. Please refer to the Current Medication list given to you today.  *If you need a refill on your cardiac medications before your next appointment, please call your pharmacy*   Lab Work: None ordered   Testing/Procedures: None ordered   Follow-Up: At [facility], you and your health needs are our priority.  As part of our continuing mission to provide you with exceptional heart care, we have created designated Provider Care Teams.  These Care Teams include your primary Cardiologist (physician) and Advanced Practice Providers (APPs -  Physician Assistants and Nurse Practitioners) who all work together to provide you with the care you need, when you need it.  We recommend signing up for the patient portal called "MyChart".  Sign up information is provided on this After Visit Summary.  MyChart is used to connect with patients for Virtual Visits (Telemedicine).  Patients are able to view lab/test results, encounter notes, upcoming appointments, etc.  Non-urgent messages can be sent to your provider as well.   To learn more about what you can do with MyChart, go to [email or web address].    Your next appointment:   1 year(s)  The format for your next appointment:   In Person  Provider:   Doralee Albino, MD{    Thank you for choosing [facility]!!   [phone]  Other Instructions

## 2022-11-02 NOTE — Progress Notes (Signed)
Electrophysiology Office Note:    Date:  11/02/2022   ID:  [name], DOB Nov 30, 1960, MRN [id number]  PCP:  John Manns, MD   [place] Providers Cardiologist:  None Electrophysiologist:  Melida Quitter, MD     Referring MD: John Manns, MD   History of Present Illness:    [name] is a 62 y.o. male with a hx listed below, significant for atrial fibrillation s/p ablation who presents for routine follow-up.  He underwent A-fib ablation by Dr. Rayann [name] in 2009 and again a redo in 2011.  He reports that he has not had any symptoms of atrial fibrillation since the second ablation.  He has a history of morbid obesity and has lost 120 pounds in the past.  Unfortunately, since COVID he has regained a significant amount of weight since he has not been exercising as much.    Past Medical History:  Diagnosis Date   Atrial fibrillation ([place])    persistent   Cerebrovascular disease    Coronary artery disease 2006   s/p PCI x3    Diabetes mellitus    resolved with weight loss   Hyperlipidemia    Hypertension    resolved with weight loss   Morbid obesity ([place])    prior, has lost 120 lbs w/ lifestyle modification   OSA (obstructive sleep apnea)    improved with weight loss    Past Surgical History:  Procedure Laterality Date   ATRIAL ABLATION SURGERY  02/08/09 and 07/21/10   EYE SURGERY     at 43 months of age    Current Medications: Current Meds  Medication Sig   aspirin 81 MG EC tablet Take 81 mg by mouth daily.   carvedilol (COREG) 12.5 MG tablet TAKE 1 TABLET BY MOUTH TWICE DAILY WITH MEALS   lisinopril (ZESTRIL) 5 MG tablet Take 1 tablet (5 mg total) by mouth daily.   Multiple Vitamins-Iron (MULTIVITAMIN/IRON) TABS Take 1 tablet by mouth daily.   nitroGLYCERIN (NITROSTAT) 0.4 MG SL tablet DISSOLVE ONE TABLET UNDER THE TONGUE EVERY 5 MINUTES AS NEEDED FOR CHEST PAIN.DO NOT EXCEED A TOTAL OF 3 DOSES IN 15 MINUTES.   Omega-3 Fatty Acids  (FISH OIL) 1000 MG CAPS Take 2 capsules by mouth daily.   simvastatin (ZOCOR) 20 MG tablet Take 1 tablet (20 mg total) by mouth daily.     Allergies:   Patient has no known allergies.   Social History   Socioeconomic History   Marital status: Married    Spouse name: Not on file   Number of children: Not on file   Years of education: Not on file   Highest education level: Not on file  Occupational History   Not on file  Tobacco Use   Smoking status: Never   Smokeless tobacco: Never  Vaping Use   Vaping Use: Never used  Substance and Sexual Activity   Alcohol use: No   Drug use: No   Sexual activity: Not on file  Other Topics Concern   Not on file  Social History Narrative   Exercise regularly. Full time mortgage collections. Married, wife [name]   Social Determinants of Health   Financial Resource Strain: Not on file  Food Insecurity: Not on file  Transportation Needs: Not on file  Physical Activity: Not on file  Stress: Not on file  Social Connections: Not on file     Family History: The patient's family history includes Dementia in his mother.  ROS:  Please see the history of present illness.    All other systems reviewed and are negative.  EKGs/Labs/Other Studies Reviewed Today:     TTE: 2020-11-05 EF 55-60%  EKG:  Last EKG results: today -- sinus rhythm   Recent Labs: No results found for requested labs within last 365 days.     Physical Exam:    VS:  BP 126/80   Pulse 89   Ht 6' (1.829 m)   Wt 258 lb (117 kg)   SpO2 97%   BMI 34.99 kg/m     Wt Readings from Last 3 Encounters:  11/02/22 258 lb (117 kg)  08/22/21 253 lb 6.4 oz (114.9 kg)  08/26/20 250 lb 9.6 oz (113.7 kg)     GEN: Well nourished, well developed in no acute distress CARDIAC: RRR, no murmurs, rubs, gallops RESPIRATORY:  Normal work of breathing MUSCULOSKELETAL: no edema    ASSESSMENT & PLAN:    Paroxysmal atrial fibrillation Well controlled post  ablation Chads2vasc score is 2 -- CAD and HTN. Both CAD and HTN are controlled, he remains AF-free. On ASA 81. Risk of full anticoagulation likely outweighs benefit.  2. HTN Stable No change required today  3. CAD No ischemic symptoms Continue simvastatin '20mg'$  daily  4. Overweight Body mass index is 34.37 kg/m. Lifestyle modification advised       Medication Adjustments/Labs and Tests Ordered: Current medicines are reviewed at length with the patient today.  Concerns regarding medicines are outlined above.  No orders of the defined types were placed in this encounter.  No orders of the defined types were placed in this encounter.    Signed, Melida Quitter, MD  11/02/2022 9:50 AM    [place]

## 2022-12-04 ENCOUNTER — Other Ambulatory Visit: Payer: Self-pay

## 2022-12-04 MED ORDER — LISINOPRIL 5 MG PO TABS: 5.0000 mg | ORAL_TABLET | Freq: Every day | ORAL | 3 refills | Status: DC

## 2022-12-11 ENCOUNTER — Other Ambulatory Visit: Payer: Self-pay | Admitting: *Deleted

## 2022-12-11 MED ORDER — SIMVASTATIN 20 MG PO TABS: 20.0000 mg | ORAL_TABLET | Freq: Every day | ORAL | 3 refills | Status: DC

## 2022-12-11 MED ORDER — CARVEDILOL 12.5 MG PO TABS: 12.5000 mg | ORAL_TABLET | Freq: Two times a day (BID) | ORAL | 2 refills | Status: DC

## 2022-12-23 DIAGNOSIS — H60502 Unspecified acute noninfective otitis externa, left ear: Secondary | ICD-10-CM | POA: Diagnosis not present

## 2022-12-23 DIAGNOSIS — H6122 Impacted cerumen, left ear: Secondary | ICD-10-CM | POA: Diagnosis not present

## 2022-12-25 DIAGNOSIS — H6122 Impacted cerumen, left ear: Secondary | ICD-10-CM | POA: Diagnosis not present

## 2023-02-05 DIAGNOSIS — Z0001 Encounter for general adult medical examination with abnormal findings: Secondary | ICD-10-CM | POA: Diagnosis not present

## 2023-02-05 DIAGNOSIS — Z6834 Body mass index (BMI) 34.0-34.9, adult: Secondary | ICD-10-CM | POA: Diagnosis not present

## 2023-02-05 DIAGNOSIS — R5383 Other fatigue: Secondary | ICD-10-CM | POA: Diagnosis not present

## 2023-02-05 DIAGNOSIS — E1169 Type 2 diabetes mellitus with other specified complication: Secondary | ICD-10-CM | POA: Diagnosis not present

## 2023-02-05 DIAGNOSIS — I119 Hypertensive heart disease without heart failure: Secondary | ICD-10-CM | POA: Diagnosis not present

## 2023-02-05 DIAGNOSIS — E782 Mixed hyperlipidemia: Secondary | ICD-10-CM | POA: Diagnosis not present

## 2023-02-05 DIAGNOSIS — Z125 Encounter for screening for malignant neoplasm of prostate: Secondary | ICD-10-CM | POA: Diagnosis not present

## 2023-03-19 ENCOUNTER — Telehealth: Payer: Self-pay | Admitting: Cardiovascular Disease

## 2023-03-19 NOTE — Telephone Encounter (Signed)
Patient c/o Palpitations:  High priority if patient c/o lightheadedness, shortness of breath, or chest pain  How long have you had palpitations/irregular HR/ Afib? Are you having the symptoms now? Palpitations   Are you currently experiencing lightheadedness, SOB or CP? No   Do you have a history of afib (atrial fibrillation) or irregular heart rhythm? Yes   Have you checked your BP or HR? (document readings if available): 148/94 hr is 80's-90's   Are you experiencing any other symptoms?  States he feels like his heart is racing and he is back in afib. Please advise.

## 2023-03-19 NOTE — Telephone Encounter (Signed)
Patient thinks he is back into AF since yesterday, stated feeling like his heart was racing and slight "heaviness" in breathing at times. Patient denies any chest pain or dizziness. Patient states his heart rate has been running 70-80's (checked manually and with BP monitor) and blood pressure 140/90's. Patient wondering if he should be back on anticoagulation therapy (was on coumadin prior to last ablation in 2011) or wanting a plan moving forward if another ablation would be possible/needed. Scheduled patient to see Dr Nelly Laurence 03/20/23.

## 2023-03-20 ENCOUNTER — Ambulatory Visit: Payer: BC Managed Care – PPO | Attending: Cardiovascular Disease | Admitting: Cardiovascular Disease

## 2023-03-20 ENCOUNTER — Encounter: Payer: Self-pay | Admitting: Cardiovascular Disease

## 2023-03-20 VITALS — BP 114/82 | HR 82 | Ht 72.0 in | Wt 251.0 lb

## 2023-03-20 DIAGNOSIS — I480 Paroxysmal atrial fibrillation: Secondary | ICD-10-CM | POA: Diagnosis not present

## 2023-03-20 NOTE — Patient Instructions (Signed)
Medication Instructions:  Your physician recommends that you continue on your current medications as directed. Please refer to the Current Medication list given to you today. *If you need a refill on your cardiac medications before your next appointment, please call your pharmacy*   Follow-Up: At [facility], you and your health needs are our priority.  As part of our continuing mission to provide you with exceptional heart care, we have created designated Provider Care Teams.  These Care Teams include your primary Cardiologist (physician) and Advanced Practice Providers (APPs -  Physician Assistants and Nurse Practitioners) who all work together to provide you with the care you need, when you need it.  We recommend signing up for the patient portal called "MyChart".  Sign up information is provided on this After Visit Summary.  MyChart is used to connect with patients for Virtual Visits (Telemedicine).  Patients are able to view lab/test results, encounter notes, upcoming appointments, etc.  Non-urgent messages can be sent to your provider as well.   To learn more about what you can do with MyChart, go to [email or web address].    Your next appointment:   December 2024  Provider:   York Pellant, MD

## 2023-03-20 NOTE — Progress Notes (Signed)
  Electrophysiology Office Note:    Date:  03/20/2023   ID:  [name], DOB [birth year], MRN [id number]  PCP:  Cassie Freer, MD   [facility] Providers Cardiologist:  None Electrophysiologist:  Maurice Small, MD     Referring MD: Cassie Freer, MD   History of Present Illness:    [name] is a 62 y.o. male with a hx listed below, significant for CAD s/p PCI, atrial fibrillation s/p ablation, OSA who presents for routine follow-up.  He underwent A-fib ablation by Dr. [name] in 2009 and again a redo in 2011.  He reports that he has not had any symptoms of atrial fibrillation since the second ablation.  He has a history of morbid obesity and has lost 120 pounds in the past.  Unfortunately, since COVID he has regained a significant amount of weight since he has not been exercising as much.  He is seen today as a semiurgent visit.  He awoke yesterday with a few palpitations that lasted a few seconds.  He was concerned that this with recurrence of atrial fibrillation and sought an early visit.   EKGs/Labs/Other Studies Reviewed Today:     TTE: 11-07-2020 EF 55-60%  EKG:  Last EKG results: today -- sinus rhythm   Recent Labs: No results found for requested labs within last 365 days.     Physical Exam:    VS:  BP 114/82   Pulse 82   Ht 6' (1.829 m)   Wt 251 lb (113.9 kg)   SpO2 98%   BMI 34.04 kg/m     Wt Readings from Last 3 Encounters:  03/20/23 251 lb (113.9 kg)  11/02/22 258 lb (117 kg)  08/22/21 253 lb 6.4 oz (114.9 kg)     GEN: Well nourished, well developed in no acute distress CARDIAC: RRR, no murmurs, rubs, gallops RESPIRATORY:  Normal work of breathing MUSCULOSKELETAL: no edema    ASSESSMENT & PLAN:    Paroxysmal atrial fibrillation Well controlled post ablation Chads2vasc score is 2 -- CAD and HTN. Both CAD and HTN are controlled, he remains AF-free. On ASA 81 for CAD. Risk of full anticoagulation likely outweighs  benefit. With a single, brief recurrence of palpitations, I recommended that he get an AliveCor or similar rhythm monitoring device and alert [place] if he has recurrence of palpitations.  2. HTN Stable No change required today  3. CAD No ischemic symptoms Continue simvastatin 20mg  daily, ASA 81  4. Overweight Body mass index is 34.04 kg/m. Lifestyle modification advised       Medication Adjustments/Labs and Tests Ordered: Current medicines are reviewed at length with the patient today.  Concerns regarding medicines are outlined above.  Orders Placed This Encounter  Procedures   EKG 12-Lead   No orders of the defined types were placed in this encounter.    Signed, Maurice Small, MD  03/20/2023 2:30 PM    [facility]

## 2023-06-08 DIAGNOSIS — Z03818 Encounter for observation for suspected exposure to other biological agents ruled out: Secondary | ICD-10-CM | POA: Diagnosis not present

## 2023-06-08 DIAGNOSIS — R6883 Chills (without fever): Secondary | ICD-10-CM | POA: Diagnosis not present

## 2023-06-08 DIAGNOSIS — R051 Acute cough: Secondary | ICD-10-CM | POA: Diagnosis not present

## 2023-06-08 DIAGNOSIS — M791 Myalgia, unspecified site: Secondary | ICD-10-CM | POA: Diagnosis not present

## 2023-06-11 DIAGNOSIS — R6883 Chills (without fever): Secondary | ICD-10-CM | POA: Diagnosis not present

## 2023-06-11 DIAGNOSIS — M791 Myalgia, unspecified site: Secondary | ICD-10-CM | POA: Diagnosis not present

## 2023-06-11 DIAGNOSIS — R051 Acute cough: Secondary | ICD-10-CM | POA: Diagnosis not present

## 2023-06-11 DIAGNOSIS — Z03818 Encounter for observation for suspected exposure to other biological agents ruled out: Secondary | ICD-10-CM | POA: Diagnosis not present

## 2023-06-13 DIAGNOSIS — Z23 Encounter for immunization: Secondary | ICD-10-CM | POA: Diagnosis not present

## 2023-06-28 DIAGNOSIS — R051 Acute cough: Secondary | ICD-10-CM | POA: Diagnosis not present

## 2023-06-28 DIAGNOSIS — Z03818 Encounter for observation for suspected exposure to other biological agents ruled out: Secondary | ICD-10-CM | POA: Diagnosis not present

## 2023-06-28 DIAGNOSIS — R509 Fever, unspecified: Secondary | ICD-10-CM | POA: Diagnosis not present

## 2023-08-08 ENCOUNTER — Ambulatory Visit: Payer: BC Managed Care – PPO | Attending: Cardiovascular Disease | Admitting: Cardiovascular Disease

## 2023-08-08 ENCOUNTER — Encounter: Payer: Self-pay | Admitting: Cardiovascular Disease

## 2023-08-08 VITALS — BP 114/70 | HR 70 | Ht 72.0 in | Wt 253.0 lb

## 2023-08-08 DIAGNOSIS — I480 Paroxysmal atrial fibrillation: Secondary | ICD-10-CM

## 2023-08-08 NOTE — Progress Notes (Signed)
  Electrophysiology Office Note:    Date:  08/08/2023   ID:  [name], DOB 1961/04/27, MRN [id number]  PCP:  Penelope Galas, MD   [facility] Providers Cardiologist:  None Electrophysiologist:  Maurice Small, MD     Referring MD: Cassie Freer, MD   History of Present Illness:    [name] is a 62 y.o. male with a hx listed below, significant for CAD s/p PCI, atrial fibrillation s/p ablation, OSA who presents for routine follow-up.  He underwent A-fib ablation by Dr. [name] in 2009 and again a redo in 2011.  He reports that he has not had any symptoms of atrial fibrillation since the second ablation.  He has a history of morbid obesity and has lost 120 pounds in the past.  Unfortunately, since COVID he has regained a significant amount of weight since he has not been exercising as much.  He was last seen in clinic on March 20, 2023.  The day before, he had an episode lasting about 3 hours.  Since this single episode, he has not had any other symptoms of recurrence.   EKGs/Labs/Other Studies Reviewed Today:     TTE: 10/31/20 EF 55-60%  EKG:  Last EKG results: today -- sinus rhythm   Recent Labs: No results found for requested labs within last 365 days.     Physical Exam:    VS:  BP 114/70   Pulse 70   Ht 6' (1.829 m)   Wt 253 lb (114.8 kg)   SpO2 96%   BMI 34.31 kg/m     Wt Readings from Last 3 Encounters:  08/08/23 253 lb (114.8 kg)  03/20/23 251 lb (113.9 kg)  11/02/22 258 lb (117 kg)     GEN: Well nourished, well developed in no acute distress CARDIAC: RRR, no murmurs, rubs, gallops RESPIRATORY:  Normal work of breathing MUSCULOSKELETAL: no edema    ASSESSMENT & PLAN:    Paroxysmal atrial fibrillation Well controlled post ablation (2009, 2011) Chads2vasc score is 2 -- CAD and HTN. Both CAD and HTN are controlled, he remains AF-free. On ASA 81 for CAD. Risk of full anticoagulation likely outweighs benefit. With a single,  brief recurrence of palpitations, I recommended that he get an AliveCor or similar rhythm monitoring device and alert [place] if he has recurrence of palpitations.  HTN Stable No change required today  CAD No ischemic symptoms Continue simvastatin 20mg  daily, ASA 81  Overweight Body mass index is 34.31 kg/m. Lifestyle modification advised       Medication Adjustments/Labs and Tests Ordered: Current medicines are reviewed at length with the patient today.  Concerns regarding medicines are outlined above.  Orders Placed This Encounter  Procedures   EKG 12-Lead   No orders of the defined types were placed in this encounter.    Signed, Maurice Small, MD  08/08/2023 10:08 AM    [facility]

## 2023-08-08 NOTE — Patient Instructions (Signed)

## 2023-09-17 DIAGNOSIS — H6123 Impacted cerumen, bilateral: Secondary | ICD-10-CM | POA: Diagnosis not present

## 2023-09-26 DIAGNOSIS — R1310 Dysphagia, unspecified: Secondary | ICD-10-CM | POA: Diagnosis not present

## 2023-10-30 DIAGNOSIS — R1319 Other dysphagia: Secondary | ICD-10-CM | POA: Diagnosis not present

## 2023-11-13 DIAGNOSIS — I2510 Atherosclerotic heart disease of native coronary artery without angina pectoris: Secondary | ICD-10-CM | POA: Diagnosis not present

## 2023-11-13 DIAGNOSIS — K2289 Other specified disease of esophagus: Secondary | ICD-10-CM | POA: Diagnosis not present

## 2023-11-13 DIAGNOSIS — K449 Diaphragmatic hernia without obstruction or gangrene: Secondary | ICD-10-CM | POA: Diagnosis not present

## 2023-11-13 DIAGNOSIS — K259 Gastric ulcer, unspecified as acute or chronic, without hemorrhage or perforation: Secondary | ICD-10-CM | POA: Diagnosis not present

## 2023-11-13 DIAGNOSIS — K3189 Other diseases of stomach and duodenum: Secondary | ICD-10-CM | POA: Diagnosis not present

## 2023-11-13 DIAGNOSIS — R1319 Other dysphagia: Secondary | ICD-10-CM | POA: Diagnosis not present

## 2023-11-13 DIAGNOSIS — K222 Esophageal obstruction: Secondary | ICD-10-CM | POA: Diagnosis not present

## 2023-12-18 ENCOUNTER — Other Ambulatory Visit: Payer: Self-pay | Admitting: Cardiovascular Disease

## 2024-03-11 DIAGNOSIS — R1319 Other dysphagia: Secondary | ICD-10-CM | POA: Diagnosis not present

## 2024-03-11 DIAGNOSIS — K279 Peptic ulcer, site unspecified, unspecified as acute or chronic, without hemorrhage or perforation: Secondary | ICD-10-CM | POA: Diagnosis not present

## 2024-03-24 DIAGNOSIS — I119 Hypertensive heart disease without heart failure: Secondary | ICD-10-CM | POA: Diagnosis not present

## 2024-03-24 DIAGNOSIS — Z0001 Encounter for general adult medical examination with abnormal findings: Secondary | ICD-10-CM | POA: Diagnosis not present

## 2024-03-24 DIAGNOSIS — E782 Mixed hyperlipidemia: Secondary | ICD-10-CM | POA: Diagnosis not present

## 2024-03-24 DIAGNOSIS — E1169 Type 2 diabetes mellitus with other specified complication: Secondary | ICD-10-CM | POA: Diagnosis not present

## 2024-03-24 DIAGNOSIS — Z125 Encounter for screening for malignant neoplasm of prostate: Secondary | ICD-10-CM | POA: Diagnosis not present

## 2024-04-28 DIAGNOSIS — R059 Cough, unspecified: Secondary | ICD-10-CM | POA: Diagnosis not present

## 2024-04-28 DIAGNOSIS — J029 Acute pharyngitis, unspecified: Secondary | ICD-10-CM | POA: Diagnosis not present

## 2024-04-28 DIAGNOSIS — U071 COVID-19: Secondary | ICD-10-CM | POA: Diagnosis not present

## 2024-05-07 DIAGNOSIS — H6123 Impacted cerumen, bilateral: Secondary | ICD-10-CM | POA: Diagnosis not present

## 2024-05-28 DIAGNOSIS — R1319 Other dysphagia: Secondary | ICD-10-CM | POA: Diagnosis not present

## 2024-05-28 DIAGNOSIS — K279 Peptic ulcer, site unspecified, unspecified as acute or chronic, without hemorrhage or perforation: Secondary | ICD-10-CM | POA: Diagnosis not present

## 2024-06-11 DIAGNOSIS — Z23 Encounter for immunization: Secondary | ICD-10-CM | POA: Diagnosis not present

## 2024-06-18 DIAGNOSIS — R059 Cough, unspecified: Secondary | ICD-10-CM | POA: Diagnosis not present

## 2024-06-18 DIAGNOSIS — R6883 Chills (without fever): Secondary | ICD-10-CM | POA: Diagnosis not present

## 2024-06-18 DIAGNOSIS — J00 Acute nasopharyngitis [common cold]: Secondary | ICD-10-CM | POA: Diagnosis not present

## 2024-06-18 DIAGNOSIS — Z03818 Encounter for observation for suspected exposure to other biological agents ruled out: Secondary | ICD-10-CM | POA: Diagnosis not present

## 2024-08-14 ENCOUNTER — Ambulatory Visit: Attending: Cardiovascular Disease | Admitting: Cardiovascular Disease

## 2024-08-14 ENCOUNTER — Encounter: Payer: Self-pay | Admitting: Cardiovascular Disease

## 2024-08-14 VITALS — BP 134/75 | HR 80 | Resp 16 | Ht 72.0 in | Wt 268.6 lb

## 2024-08-14 DIAGNOSIS — I480 Paroxysmal atrial fibrillation: Secondary | ICD-10-CM | POA: Diagnosis not present

## 2024-08-14 NOTE — Patient Instructions (Signed)
° °  Follow-Up: At [facility], you and your health needs are our priority.  As part of our continuing mission to provide you with exceptional heart care, our providers are all part of one team.  This team includes your primary Cardiologist (physician) and Advanced Practice Providers or APPs (Physician Assistants and Nurse Practitioners) who all work together to provide you with the care you need, when you need it.  Your next appointment:   1 year(s)  Provider:   Eulas Furbish, MD

## 2024-08-14 NOTE — Progress Notes (Signed)
°  Electrophysiology Office Note:    Date:  08/14/2024   ID:  [name], DOB Dec 10, 1960, MRN [id number]  PCP:  Meredeth Prentice KIDD, MD   [facility] Providers Cardiologist:  None Electrophysiologist:  Eulas FORBES Furbish, MD     Referring MD: Meredeth Prentice KIDD, MD   History of Present Illness:    [name] is a 63 y.o. male with a hx listed below, significant for CAD s/p PCI, atrial fibrillation s/p ablation, OSA who presents for routine follow-up.  He underwent A-fib ablation by Dr. Kelsie in 2009 and again a redo in 2011.  He reports that he has not had any symptoms of atrial fibrillation since the second ablation.  He has a history of morbid obesity and has lost 120 pounds in the past.  Unfortunately, since COVID he has regained a significant amount of weight since he has not been exercising as much.  He was last seen in clinic about a year ago.  He has not had any symptoms of atrial fibrillation since then.   EKGs/Labs/Other Studies Reviewed Today:     TTE: 11/03/20 EF 55-60%  EKG:  Last EKG results: today -- sinus rhythm   Recent Labs: No results found for requested labs within last 365 days.     Physical Exam:    VS:  BP 134/75 (BP Location: Left Arm, Patient Position: Sitting, Cuff Size: Large)   Pulse 80   Resp 16   Ht 6' (1.829 m)   Wt 268 lb 9.6 oz (121.8 kg)   SpO2 96%   BMI 36.43 kg/m     Wt Readings from Last 3 Encounters:  08/14/24 268 lb 9.6 oz (121.8 kg)  08/08/23 253 lb (114.8 kg)  03/20/23 251 lb (113.9 kg)     GEN: Well nourished, well developed in no acute distress CARDIAC: RRR, no murmurs, rubs, gallops RESPIRATORY:  Normal work of breathing MUSCULOSKELETAL: no edema    ASSESSMENT & PLAN:    Paroxysmal atrial fibrillation Well controlled post ablation (2009, 2011) Chads2vasc score is 2 -- CAD and HTN. Both CAD and HTN are controlled, he remains AF-free. On ASA 81 for CAD. Risk of full anticoagulation likely outweighs  benefit. With a single, brief recurrence of palpitations, I recommended that he get an AliveCor or similar rhythm monitoring device and alert us  if he has recurrence of palpitations.  HTN Stable No change required today  CAD No ischemic symptoms Continue simvastatin  20mg  daily, ASA 81  Overweight Body mass index is 34.31 kg/m. Lifestyle modification advised       Medication Adjustments/Labs and Tests Ordered: Current medicines are reviewed at length with the patient today.  Concerns regarding medicines are outlined above.  Orders Placed This Encounter  Procedures   EKG 12-Lead   No orders of the defined types were placed in this encounter.    Signed, Eulas FORBES Furbish, MD  08/14/2024 9:17 AM    [facility]

## 2024-09-17 ENCOUNTER — Other Ambulatory Visit: Payer: Self-pay | Admitting: Cardiovascular Disease

## 2024-09-17 NOTE — Telephone Encounter (Signed)
 In accordance with refill protocols, please review and address the following requirements before this medication refill can be authorized:  Labs
